# Patient Record
Sex: Female | Born: 1975 | Race: Black or African American | Hispanic: No | Marital: Married | Smoking: Never smoker
Health system: Southern US, Community
[De-identification: ages and names within clinical notes are randomized; demographics above are authoritative.]

## PROBLEM LIST (undated history)

## (undated) DIAGNOSIS — R7303 Prediabetes: Secondary | ICD-10-CM

## (undated) DIAGNOSIS — S8990XA Unspecified injury of unspecified lower leg, initial encounter: Secondary | ICD-10-CM

## (undated) DIAGNOSIS — G8929 Other chronic pain: Secondary | ICD-10-CM

## (undated) HISTORY — DX: Prediabetes: R73.03

## (undated) HISTORY — DX: Unspecified injury of unspecified lower leg, initial encounter: S89.90XA

## (undated) HISTORY — PX: WISDOM TOOTH EXTRACTION: SHX21

## (undated) HISTORY — DX: Other chronic pain: G89.29

## (undated) HISTORY — PX: NO PAST SURGERIES: SHX2092

---

## 1998-12-04 ENCOUNTER — Encounter: Payer: Self-pay | Admitting: Emergency Medicine

## 1998-12-04 ENCOUNTER — Emergency Department (HOSPITAL_COMMUNITY): Admission: EM | Admit: 1998-12-04 | Discharge: 1998-12-04 | Payer: Self-pay | Admitting: Emergency Medicine

## 1999-03-12 ENCOUNTER — Emergency Department (HOSPITAL_COMMUNITY): Admission: EM | Admit: 1999-03-12 | Discharge: 1999-03-13 | Payer: Self-pay | Admitting: *Deleted

## 2000-10-16 ENCOUNTER — Other Ambulatory Visit: Admission: RE | Admit: 2000-10-16 | Discharge: 2000-10-16 | Payer: Self-pay | Admitting: Family Medicine

## 2015-12-04 ENCOUNTER — Emergency Department (HOSPITAL_COMMUNITY)
Admission: EM | Admit: 2015-12-04 | Discharge: 2015-12-05 | Disposition: A | Discharge status: Home / Self Care | Payer: BLUE CROSS/BLUE SHIELD | Attending: Emergency Medicine | Admitting: Emergency Medicine

## 2015-12-04 ENCOUNTER — Encounter (HOSPITAL_COMMUNITY): Payer: Self-pay | Admitting: *Deleted

## 2015-12-04 DIAGNOSIS — J0100 Acute maxillary sinusitis, unspecified: Secondary | ICD-10-CM | POA: Insufficient documentation

## 2015-12-04 DIAGNOSIS — R110 Nausea: Secondary | ICD-10-CM | POA: Insufficient documentation

## 2015-12-04 DIAGNOSIS — R200 Anesthesia of skin: Secondary | ICD-10-CM | POA: Diagnosis not present

## 2015-12-04 DIAGNOSIS — R51 Headache: Secondary | ICD-10-CM

## 2015-12-04 DIAGNOSIS — R519 Headache, unspecified: Secondary | ICD-10-CM

## 2015-12-04 DIAGNOSIS — Z3202 Encounter for pregnancy test, result negative: Secondary | ICD-10-CM | POA: Diagnosis not present

## 2015-12-04 DIAGNOSIS — R42 Dizziness and giddiness: Secondary | ICD-10-CM | POA: Diagnosis not present

## 2015-12-04 LAB — BASIC METABOLIC PANEL
Anion gap: 10 (ref 5–15)
BUN: 9 mg/dL (ref 6–20)
CO2: 23 mmol/L (ref 22–32)
Calcium: 9 mg/dL (ref 8.9–10.3)
Chloride: 105 mmol/L (ref 101–111)
Creatinine, Ser: 0.74 mg/dL (ref 0.44–1.00)
GFR calc Af Amer: >60 mL/min (ref >60)
GFR calc non Af Amer: >60 mL/min (ref >60)
Glucose, Bld: 93 mg/dL (ref 65–99)
Potassium: 4 mmol/L (ref 3.5–5.1)
Sodium: 138 mmol/L (ref 135–145)

## 2015-12-04 LAB — CBC WITH DIFFERENTIAL/PLATELET
Basophils Absolute: 0 10*3/uL (ref 0.0–0.1)
Basophils Relative: 0 %
Eosinophils Absolute: 0.1 10*3/uL (ref 0.0–0.7)
Eosinophils Relative: 1 %
HCT: 43.9 % (ref 36.0–46.0)
Hemoglobin: 15.1 g/dL — ABNORMAL HIGH (ref 12.0–15.0)
Lymphocytes Relative: 31 %
Lymphs Abs: 1.8 10*3/uL (ref 0.7–4.0)
MCH: 30.7 pg (ref 26.0–34.0)
MCHC: 34.4 g/dL (ref 30.0–36.0)
MCV: 89.2 fL (ref 78.0–100.0)
Monocytes Absolute: 0.3 10*3/uL (ref 0.1–1.0)
Monocytes Relative: 6 %
Neutro Abs: 3.6 10*3/uL (ref 1.7–7.7)
Neutrophils Relative %: 62 %
Platelets: 323 10*3/uL (ref 150–400)
RBC: 4.92 MIL/uL (ref 3.87–5.11)
RDW: 13.1 % (ref 11.5–15.5)
WBC: 5.9 10*3/uL (ref 4.0–10.5)

## 2015-12-04 LAB — I-STAT BETA HCG BLOOD, ED (MC, WL, AP ONLY): I-stat hCG, quantitative: <5 m[IU]/mL (ref <5)

## 2015-12-04 MED ORDER — PROCHLORPERAZINE EDISYLATE 5 MG/ML IJ SOLN
10.0000 mg | Freq: Once | INTRAMUSCULAR | Status: AC
  Administered 2015-12-04: 10 mg via INTRAVENOUS
  Filled 2015-12-04: qty 2

## 2015-12-04 MED ORDER — DIPHENHYDRAMINE HCL 50 MG/ML IJ SOLN
12.5000 mg | Freq: Once | INTRAMUSCULAR | Status: AC
Start: 2015-12-04 — End: 2015-12-04
  Administered 2015-12-04: 12.5 mg via INTRAVENOUS
  Filled 2015-12-04: qty 1

## 2015-12-04 MED ORDER — SODIUM CHLORIDE 0.9 % IV BOLUS (SEPSIS)
1000.0000 mL | Freq: Once | INTRAVENOUS | Status: AC
  Administered 2015-12-04: 1000 mL via INTRAVENOUS

## 2015-12-04 NOTE — ED Notes (Addendum)
Pt states uri symptoms x 1 week and headache x 3 days.  Today while at work she experienced an episode of blurred vision lasting 30 min.  Denies photophobia and nausea presently.  Neuro intact in triage.

## 2015-12-04 NOTE — ED Notes (Signed)
Pt states she had one episode of vomiting.  Denies nausea at this time

## 2015-12-04 NOTE — ED Provider Notes (Signed)
CSN: [id number]     Arrival date & time 12/04/15  1614 History   First MD Initiated Contact with Patient 12/04/15 2201     Chief Complaint  Patient presents with  . Headache    HPI   Linda Ross is a 40 y.o. female with no pertinent PMH who presents to the ED with nasal congestion and cough x 1 week and headache x 3 days. She denies exacerbating factors. She has tried taking tylenol at home with no significant symptom relief. She denies fever, chills, chest pain, shortness of breath, abdominal pain. She reports nausea, though denies vomiting. She states she experienced blurry vision x 15 minutes while at work today, though states this is now resolved. She reports she has a history of headaches, but that this headache is at the top of her head, and that she usually has headaches distributed over her forehead and notes this headache is more severe. She notes associated dizziness and lightheadedness, now resolved. She denies numbness, weakness, paresthesia.   History reviewed. No pertinent past medical history. History reviewed. No pertinent past surgical history. No family history on file. Social History  Substance Use Topics  . Smoking status: Never Smoker   . Smokeless tobacco: None  . Alcohol Use: No   OB History    No data available      Review of Systems  Constitutional: Negative for fever and chills.  HENT: Positive for congestion.   Eyes: Positive for visual disturbance.  Respiratory: Positive for cough. Negative for shortness of breath.   Cardiovascular: Negative for chest pain.  Gastrointestinal: Positive for nausea. Negative for vomiting and abdominal pain.  Neurological: Positive for dizziness, light-headedness, numbness and headaches. Negative for syncope and weakness.  All other systems reviewed and are negative.     Allergies  Review of patient's allergies indicates no known allergies.  Home Medications   Prior to Admission medications   Medication Sig  Start Date End Date Taking? Authorizing Provider  amoxicillin-clavulanate (AUGMENTIN) 875-125 MG tablet Take 1 tablet by mouth every 12 (twelve) hours. 12/05/15   Marella Chimes, PA-C    BP 115/75 mmHg  Pulse 87  Temp(Src) 97.9 F (36.6 C) (Oral)  Resp 20  Ht 5\' 8"  (1.727 m)  Wt 80.74 kg  BMI 27.07 kg/m2  SpO2 95%  LMP 12/01/2015 Physical Exam  Constitutional: She is oriented to person, place, and time. She appears well-developed and well-nourished. No distress.  HENT:  Head: Normocephalic and atraumatic.  Right Ear: External ear normal.  Left Ear: External ear normal.  Nose: Nose normal. Right sinus exhibits no maxillary sinus tenderness and no frontal sinus tenderness. Left sinus exhibits no maxillary sinus tenderness and no frontal sinus tenderness.  Mouth/Throat: Uvula is midline, oropharynx is clear and moist and mucous membranes are normal.  Eyes: Conjunctivae, EOM and lids are normal. Pupils are equal, round, and reactive to light. Right eye exhibits no discharge. Left eye exhibits no discharge. No scleral icterus.  Neck: Normal range of motion. Neck supple.  Cardiovascular: Normal rate, regular rhythm, normal heart sounds, intact distal pulses and normal pulses.   Pulmonary/Chest: Effort normal and breath sounds normal. No respiratory distress. She has no wheezes. She has no rales.  Abdominal: Soft. Normal appearance and bowel sounds are normal. She exhibits no distension and no mass. There is no tenderness. There is no rigidity, no rebound and no guarding.  Musculoskeletal: Normal range of motion. She exhibits no edema or tenderness.  Neurological: She is alert  and oriented to person, place, and time. She has normal strength. No cranial nerve deficit or sensory deficit. Coordination normal.  Skin: Skin is warm, dry and intact. No rash noted. She is not diaphoretic. No erythema. No pallor.  Psychiatric: She has a normal mood and affect. Her speech is normal and behavior is  normal.  Nursing note and vitals reviewed.   ED Course  Procedures (including critical care time)  Labs Review Labs Reviewed  CBC WITH DIFFERENTIAL/PLATELET - Abnormal; Notable for the following:    Hemoglobin 15.1 (*)    All other components within normal limits  BASIC METABOLIC PANEL  I-STAT BETA HCG BLOOD, ED ([facility], [facility], [facility] ONLY)    Imaging Review Ct Head Wo Contrast  12/05/2015  CLINICAL DATA:  Headache for 4 days.  Blurry vision. EXAM: CT HEAD WITHOUT CONTRAST TECHNIQUE: Contiguous axial images were obtained from the base of the skull through the vertex without intravenous contrast. COMPARISON:  None. FINDINGS: Brain: No evidence of acute infarction, hemorrhage, extra-axial collection, ventriculomegaly, or mass effect. Vascular: No hyperdense vessel or unexpected calcification. Skull: Negative for fracture or focal lesion. Sinuses/Orbits: Small fluid level in the right maxillary and right side of sphenoid sinus. Mastoid air cells are clear. Other: None. IMPRESSION: 1.  No acute intracranial abnormality. 2. Right maxillary and sphenoid sinus disease, fluid levels may indicate acute sinusitis. Electronically Signed   By: Jeb Levering M.D.   On: 12/05/2015 00:24   I have personally reviewed and evaluated these images and lab results as part of my medical decision-making.   EKG Interpretation None      MDM   Final diagnoses:  Headache  Acute maxillary sinusitis, recurrence not specified    40 year old female presents with nasal congestion and cough for the past week and headaches for the past 3 days. Denies fever, chills, chest pain, shortness of breath, abdominal pain. Notes nausea, but denies vomiting. States she experienced blurry vision and dizziness prior to arrival, however reports this is resolved.   Patient is afebrile. Vital signs stable. Normal neuro exam with no focal deficit. Heart regular rate and rhythm. Lungs clear to auscultation bilaterally. Abdomen soft,  nontender, nondistended.  Will give fluids, compazine, benadryl. Labs pending.  CBC negative for leukocytosis or anemia. BMP unremarkable. Pregnancy negative. CT head negative for acute intracranial abnormality, remarkable for right maxillary and sphenoid sinus disease, fluid levels may indicate acute sinusitis. Discussed findings with patient. On reassessment, she reports significant symptom improvement. Patient is nontoxic and well-appearing, feel she is stable for discharge at this time will treat sinusitis with augmentin. Patient to follow-up with PCP. Return precautions discussed. Patient verbalizes her understanding and is in agreement with plan.  BP 115/75 mmHg  Pulse 87  Temp(Src) 97.9 F (36.6 C) (Oral)  Resp 20  Ht 5\' 8"  (1.727 m)  Wt 80.74 kg  BMI 27.07 kg/m2  SpO2 95%  LMP 12/01/2015     Marella Chimes, PA-C 12/05/15 0036  Carmin Muskrat, MD 12/05/15 [phone]

## 2015-12-05 ENCOUNTER — Emergency Department (HOSPITAL_COMMUNITY): Payer: BLUE CROSS/BLUE SHIELD

## 2015-12-05 MED ORDER — AMOXICILLIN-POT CLAVULANATE 875-125 MG PO TABS: 1.0000 | ORAL_TABLET | Freq: Two times a day (BID) | ORAL | Status: DC

## 2015-12-05 MED ORDER — AMOXICILLIN-POT CLAVULANATE 875-125 MG PO TABS
1.0000 | ORAL_TABLET | Freq: Once | ORAL | Status: AC
  Administered 2015-12-05: 1 via ORAL
  Filled 2015-12-05: qty 1

## 2015-12-05 NOTE — Discharge Instructions (Signed)
1. Medications: augmentin, usual home medications 2. Treatment: rest, drink plenty of fluids 3. Follow Up: please followup with your primary doctor for discussion of your diagnoses and further evaluation after today's visit; if you do not have a primary care doctor use the phone number listed in your discharge paperwork to find one; please return to the ER for severe headache, numbness, weakness, new or worsening symptoms   Sinusitis, Adult Sinusitis is redness, soreness, and puffiness (inflammation) of the air pockets in the bones of your face (sinuses). The redness, soreness, and puffiness can cause air and mucus to get trapped in your sinuses. This can allow germs to grow and cause an infection.  HOME CARE   Drink enough fluids to keep your pee (urine) clear or pale yellow.  Use a humidifier in your home.  Run a hot shower to create steam in the bathroom. Sit in the bathroom with the door closed. Breathe in the steam 3-4 times a day.  Put a warm, moist washcloth on your face 3-4 times a day, or as told by your doctor.  Use salt water sprays (saline sprays) to wet the thick fluid in your nose. This can help the sinuses drain.  Only take medicine as told by your doctor. GET HELP RIGHT AWAY IF:   Your pain gets worse.  You have very bad headaches.  You are sick to your stomach (nauseous).  You throw up (vomit).  You are very sleepy (drowsy) all the time.  Your face is puffy (swollen).  Your vision changes.  You have a stiff neck.  You have trouble breathing. MAKE SURE YOU:   Understand these instructions.  Will watch your condition.  Will get help right away if you are not doing well or get worse.   This information is not intended to replace advice given to you by your health care provider. Make sure you discuss any questions you have with your health care provider.   Document Released: 01/22/2008 Document Revised: 08/26/2014 Document Reviewed: 03/10/2012 Elsevier  Interactive Patient Education [employer].

## 2015-12-05 NOTE — ED Notes (Signed)
Patient able to ambulate independently  

## 2019-03-15 LAB — HM PAP SMEAR: HM Pap smear: NORMAL

## 2019-03-15 LAB — RESULTS CONSOLE HPV: CHL HPV: NEGATIVE

## 2019-08-04 ENCOUNTER — Ambulatory Visit (HOSPITAL_BASED_OUTPATIENT_CLINIC_OR_DEPARTMENT_OTHER): Admit: 2019-08-04 | Payer: BLUE CROSS/BLUE SHIELD | Admitting: Obstetrics and Gynecology

## 2019-08-04 ENCOUNTER — Encounter (HOSPITAL_BASED_OUTPATIENT_CLINIC_OR_DEPARTMENT_OTHER): Payer: Self-pay

## 2019-08-04 SURGERY — MYOMECTOMY, ROBOT-ASSISTED
Anesthesia: General

## 2020-06-20 ENCOUNTER — Other Ambulatory Visit: Payer: Self-pay | Admitting: Obstetrics and Gynecology

## 2020-07-18 NOTE — Progress Notes (Signed)
[place]  07/18/2020      Your procedure is scheduled on 07/27/2020   Report to [facility].  Call this number if you have problems the morning of [phone]  OUR ADDRESS IS [place], WE ARE LOCATED IN THE [facility] WITH [facility].   Remember:  Do not eat food after midnight. NO SOLID FOOD AFTER MIDNIGHT THE NIGHT PRIOR TO SURGERY. NOTHING BY MOUTH EXCEPT CLEAR LIQUIDS UNTIL       0430am  . PLEASE FINISH ENSURE DRINK PER SURGEON ORDER  WHICH NEEDS TO BE COMPLETED AT .0430am   Take these medicines the morning of surgery with A SIP OF WATER   None    CLEAR LIQUID DIET   Foods Allowed                                                                     Foods Excluded  Coffee and tea, regular and decaf                             liquids that you cannot  Plain Jell-O any favor except red or purple                                           see through such as: Fruit ices (not with fruit pulp)                                     milk, soups, orange juice  Iced Popsicles                                    All solid food Carbonated beverages, regular and diet                                    Cranberry, grape and apple juices Sports drinks like Gatorade Lightly seasoned clear broth or consume(fat free) Sugar, honey syrup  Sample Menu Breakfast                                Lunch                                     Supper Cranberry juice                    Beef broth                            Chicken broth Jell-O  Grape juice                           Apple juice Coffee or tea                        Jell-O                                      Popsicle                                                Coffee or tea                        Coffee or tea  _____________________________________________________________________     Do not wear jewelry, make-up or nail polish.  Do  not wear lotions, powders, or perfumes, or deoderant.  Do not shave 48 hours prior to surgery.  Men may shave face and neck.  Do not bring valuables to the hospital.  [facility] is not responsible for any belongings or valuables.  Contacts, dentures or bridgework may not be worn into surgery.  Leave your suitcase in the car.  After surgery it may be brought to your room.  For patients admitted to the hospital, discharge time will be determined by your treatment team.  Patients discharged the day of surgery will not be allowed to drive home.     Please read over the following fact sheets that you were given:

## 2020-07-19 ENCOUNTER — Other Ambulatory Visit: Payer: Self-pay

## 2020-07-19 ENCOUNTER — Encounter (HOSPITAL_COMMUNITY)
Admission: RE | Admit: 2020-07-19 | Discharge: 2020-07-19 | Disposition: A | Discharge status: Home / Self Care | Payer: BC Managed Care – PPO | Source: Ambulatory Visit | Attending: Obstetrics and Gynecology | Admitting: Obstetrics and Gynecology

## 2020-07-19 ENCOUNTER — Encounter (HOSPITAL_COMMUNITY): Payer: Self-pay

## 2020-07-19 DIAGNOSIS — Z01818 Encounter for other preprocedural examination: Secondary | ICD-10-CM | POA: Diagnosis present

## 2020-07-19 HISTORY — DX: Prediabetes: R73.03

## 2020-07-19 LAB — HEMOGLOBIN A1C
Hgb A1c MFr Bld: 5.3 % (ref 4.8–5.6)
Mean Plasma Glucose: 105.41 mg/dL

## 2020-07-19 LAB — CBC
HCT: 44 % (ref 36.0–46.0)
Hemoglobin: 14.9 g/dL (ref 12.0–15.0)
MCH: 30.3 pg (ref 26.0–34.0)
MCHC: 33.9 g/dL (ref 30.0–36.0)
MCV: 89.6 fL (ref 80.0–100.0)
Platelets: 315 10*3/uL (ref 150–400)
RBC: 4.91 MIL/uL (ref 3.87–5.11)
RDW: 14 % (ref 11.5–15.5)
WBC: 5.3 10*3/uL (ref 4.0–10.5)
nRBC: 0 % (ref 0.0–0.2)

## 2020-07-19 LAB — BASIC METABOLIC PANEL
Anion gap: 11 (ref 5–15)
BUN: 13 mg/dL (ref 6–20)
CO2: 20 mmol/L — ABNORMAL LOW (ref 22–32)
Calcium: 9.1 mg/dL (ref 8.9–10.3)
Chloride: 105 mmol/L (ref 98–111)
Creatinine, Ser: 0.71 mg/dL (ref 0.44–1.00)
GFR, Estimated: >60 mL/min (ref >60)
Glucose, Bld: 85 mg/dL (ref 70–99)
Potassium: 3.8 mmol/L (ref 3.5–5.1)
Sodium: 136 mmol/L (ref 135–145)

## 2020-07-19 NOTE — Progress Notes (Signed)
Have called and requested for covid positive results from [facility] tested covid positive on 05/20/2020.  Have asked for results be faxed to [phone].

## 2020-07-19 NOTE — H&P (Signed)
Linda Ross is a 44 y.o. female, G:0 presenting for a laparoscopic myomectomy because of  enlarging uterine fibroids. The patient was diagnosed in 2013 with fibroids and since that time they have significantly increased in size. On physical exam in 2020 the patient's uterus was assessed at 10-12 weeks size. The following year it had enlarged to 16 weeks size. The patient reports lower back pain, bloating, occasional urinary urgency, dyspareunia and both constipation and diarrhea. Her monthly menstrual flow lasts for 5 days with pad change only twice a day.  Her menstrual cramping is rated 7/10 but is relieved with extra strength Acetaminophen.  A pelvic ultrasound in  October 2021 revealed an anteverted uterus (520 cc): 11.29 x 8.73 x 10.06 cm, endometrium: 7.35 mm; 8 measurable fibroids #1 3.6 cm fundal IM #2 4 cm posterior fundal SS #3 5.8 cm anterior LUS IM #4 4.3 cm Right mid IM #5 1.76 cm IM LIKELY NOT ACCESSIBLE #6 2.8 cm IM #7 1.7 cm SM LIKELY NOT ACCESSIBLE #8 1. 9 cm SS Neither ovary was seen.  In 2020 the patient's uterine volume was 437 cc.  Given the patient's desire to conceive and the pattern of her fibroid enlargement,  the patient desires to undergo a laparoscopic  myomectomy.  Past Medical History  OB History: G:0  GYN History: menarche: 89[place];:  LMP : 07/17/2020;   Contraception: none;  Denies history of abnormal PAP smear.  Last PAP: Smear:2020-normal  Medical History: Vitamin D Deficiency  Surgical History: none Denies history of blood transfusions  Family History: Diabetes Mellitus, Prostate Cancer, Stroke, Alzheimer's Disease, End-Stage Renal Disease and Cardiac Arrhythmia  Social History: Married and employed as an Microbiologist;  Denies tobacco or alcohol use   Medications: Vitamin D 3 50,000 units weekly Multivitamin daily Probiotic daily  No Known Allergies   Denies sensitivity to peanuts, shellfish, soy, latex or adhesives.   ROS: Admits to  glasses, occasional headaches with some nausea and vision changes,lower extremity myalgias, and COVID in October 2021.  She denies nasal congestion, dysphagia, tinnitus, dizziness, hoarseness, cough,  chest pain, shortness of breath,  vomiting, diarrhea,constipation,  urinary frequency, urgency  dysuria, hematuria, vaginitis symptoms, pelvic pain, swelling of joints,easy bruising,  arthralgias, skin rashes, unexplained weight loss and except as is mentioned in the history of present illness, patient's review of systems is otherwise negative.     Physical Exam  Bp: 118/78;  P: 89 bpm; Temperature: 97.7 degrees F orally; Weight: 189 lbs.; Height: 189 lbs.; BMI: 28.7; O2Sat.: 99% (room air)   Neck: supple without masses or thyromegaly Lungs: clear to auscultation Heart: regular rate and rhythm Abdomen: firm mass from pelvis to 4 fingers above symphysis pubis, non-tender and no organomegaly Pelvic:EGBUS- wnl; vagina-normal rugae; uterus-16 weeks size; cervix without lesions or motion tenderness; adnexae-no tenderness or masses Extremities:  no clubbing, cyanosis or edema   Assesment: Enlarging  Uterine Fibroids   Disposition:  A discussion was held with patient regarding the indication for her procedure(s) along with the risks, which include but are not limited to: reaction to anesthesia, damage to adjacent organs, infection,  excessive bleeding, transient post operative facial edema, an open abdominal incision, inability to remove all of the fibroids and if endometrial cavity is breached, the need for a C-section delivery.   Robot-assisted myomectomy was reviewed with the patient.  Benefits of the robotic approach include lesser postoperative pain, less blood loss during surgery, reduced risk of injury to other organs due to better visualization with a  3-D HD 10 times magnifying camera, shorter hospital stay between 0-1 night and rapid recovery with return to daily routine in 2-3 weeks. Although  robot-assisted myomectomy has a longer operative time than traditional laparotomy, in a patient with good medical history, the benefits usually outweigh the risks.   Patient informed about FDA warning on use of morcellator dated 12/03/2012.   Discussed: 1. Incidence of post-operative diagnosis of sarcoma in women undergoing a hysterectomy is 2:1000  2. Annual incidence of leiomyosarcoma is 0.64/100,000 women  3. Sarcomas have the highest incidence in women over 65  4. Power morcellation involves risks of spreading tissue / disease. In he case of undiagnosed cancer, it may adversely affect the patient's prognosis.  Also discussed:  1.Lack of tactile feedback with the robotic approach leaving non-visible fibroids impossible to resect with potential future growth. 2. Need for cesarean section if there is uterine cavity entry and/or multiple incisions 3. Risk of developing  NEW fibroids estimated at 25 % The patient verbalized understanding of these risks and has consented to proceed with a Robot Assisted Myomectomy at [facility] on July 27, 2020 @ 7:30 a.m.    CSN# [id number]   Rolly Magri J. Florene Glen, PA-C  for Dr. Dede Query.[place]

## 2020-07-19 NOTE — Progress Notes (Signed)
[facility] - Preparing for Surgery Before surgery, you can play an important role.  Because skin is not sterile, your skin needs to be as free of germs as possible.  You can reduce the number of germs on your skin by washing with CHG (chlorahexidine gluconate) soap before surgery.  CHG is an antiseptic cleaner which kills germs and bonds with the skin to continue killing germs even after washing. Please DO NOT use if you have an allergy to CHG or antibacterial soaps.  If your skin becomes reddened/irritated stop using the CHG and inform your nurse when you arrive at Short Stay. Do not shave (including legs and underarms) for at least 48 hours prior to the first CHG shower.  You may shave your face/neck. Please follow these instructions carefully:  1.  Shower with CHG Soap the night before surgery and the  morning of Surgery.  2.  If you choose to wash your hair, wash your hair first as usual with your  normal  shampoo.  3.  After you shampoo, rinse your hair and body thoroughly to remove the  shampoo.                           4.  Use CHG as you would any other liquid soap.  You can apply chg directly  to the skin and wash                       Gently with a scrungie or clean washcloth.  5.  Apply the CHG Soap to your body ONLY FROM THE NECK DOWN.   Do not use on face/ open                           Wound or open sores. Avoid contact with eyes, ears mouth and genitals (private parts).                       Wash face,  Genitals (private parts) with your normal soap.             6.  Wash thoroughly, paying special attention to the area where your surgery  will be performed.  7.  Thoroughly rinse your body with warm water from the neck down.  8.  DO NOT shower/wash with your normal soap after using and rinsing off  the CHG Soap.                9.  Pat yourself dry with a clean towel.            10.  Wear clean pajamas.            11.  Place clean sheets on your bed the night of your first shower and  do not  sleep with pets. Day of Surgery : Do not apply any lotions/deodorants the morning of surgery.  Please wear clean clothes to the hospital/surgery center.  FAILURE TO FOLLOW THESE INSTRUCTIONS MAY RESULT IN THE CANCELLATION OF YOUR SURGERY PATIENT SIGNATURE_________________________________  NURSE SIGNATURE__________________________________  ________________________________________________________________________  

## 2020-07-20 NOTE — Progress Notes (Signed)
Requested x 2 this am covid results from [facility].  Received a call back.  Will need to contact pt and ask pt to call them for covid pos results from 10//09/2019.  Attempted to call pt x 2 with mailbox full.  Pt called back at 1130 am on 07/20/2020 .  Informed pt she would need to call Joelene Millin at [facility] at [phone] in order to get covid results.  Pt voiced understanding.  Also informed pt that she would need to drink Gatorade Lower sugar instead of the Ensure preop drink that was sent home with her at time of preop.  Paitent opted to go to grocery store and pick up 3- 12 ounces of Zero sugar Gatorade either in blue or orange.  And follow the same preop instructions  As with the Ensure - 2 nite before surgery at 1000pm and one am of 3 hours prior to surgery pt voiced understanding.  Pt aware if unable to get covid test results will need to have covid test on 07/24/2020/.

## 2020-07-21 NOTE — Progress Notes (Signed)
Positive covid test results from 05/20/2020 sent via email by pt to nurse.  Placed on chart.

## 2020-07-25 NOTE — Progress Notes (Addendum)
Patient called today and stated she noted in my chart that EKG done at preop was abnormal  Inforjmed pt that the EKG machine does an initial interpretation but that a cardiologist reads ekg to confirm the findings.  I also informed pt that if anything had needed attention the surgeon would have been notified .  I also informed pt that Anesthesia PA looks at EKGs also.  Informed pt that I would have Konrad Felix, PAC look at EKG again and call her with what PA states.  Konrad Felix, PA was notified of pt's call and looked at EKG .  Called pt back and told her that Konrad Felix , [place] stated findings  not concerning as long as asymptomatic and really inconclusive and need correlation with symptomatlogy.  And that patient could review with PCP. Pt stated she has had some chest pain off and on in the past but nothing significant.

## 2020-07-25 NOTE — Progress Notes (Signed)
Made Linda Ross aware that I had spoken with pt in regards to EKG and progress note made and that pt states she has had chest pain in the past off and on.

## 2020-07-27 ENCOUNTER — Encounter: Payer: Self-pay | Admitting: Internal Medicine

## 2020-07-27 ENCOUNTER — Ambulatory Visit (HOSPITAL_BASED_OUTPATIENT_CLINIC_OR_DEPARTMENT_OTHER)
Admission: RE | Admit: 2020-07-27 | Payer: BC Managed Care – PPO | Source: Ambulatory Visit | Admitting: Obstetrics and Gynecology

## 2020-07-27 ENCOUNTER — Other Ambulatory Visit: Payer: Self-pay

## 2020-07-27 ENCOUNTER — Ambulatory Visit: Payer: BC Managed Care – PPO | Admitting: Internal Medicine

## 2020-07-27 VITALS — BP 116/80 | HR 92 | Ht 68.0 in | Wt 186.0 lb

## 2020-07-27 DIAGNOSIS — Z01810 Encounter for preprocedural cardiovascular examination: Secondary | ICD-10-CM | POA: Diagnosis not present

## 2020-07-27 DIAGNOSIS — G2581 Restless legs syndrome: Secondary | ICD-10-CM

## 2020-07-27 DIAGNOSIS — R0789 Other chest pain: Secondary | ICD-10-CM

## 2020-07-27 DIAGNOSIS — R9431 Abnormal electrocardiogram [ECG] [EKG]: Secondary | ICD-10-CM | POA: Diagnosis not present

## 2020-07-27 SURGERY — MYOMECTOMY, ROBOT-ASSISTED
Anesthesia: Choice

## 2020-07-27 NOTE — Progress Notes (Signed)
OFFICE CONSULT NOTE  Chief Complaint:  Abnormal EKG, preop clearance, chest pain  Primary Care Physician: Linda Bern, MD  HPI:  Linda Ross is a 44 y.o. female who is being seen today for the evaluation of abnormal EKG, preop clearance and chest pain at the request of Linda Ross, Linda Look, MD.  This is a pleasant 44 year old female who was scheduled to have a robotic assisted myomectomy by Dr. Cletis Ross who is her GYN and primary care provider.  This was supposed to be today, however the procedure was canceled due to an abnormal preoperative EKG.  The EKG performed on 07/19/2020 demonstrated normal sinus rhythm, cannot rule out anterior infarct-age undetermined.  I personally reviewed the EKG and note that it does demonstrate poor R wave progression with transition between V3 and V4.  According to Linda Ross, she has been having intermittent episodes of chest pain which sounds sharp and not necessarily associated with exertion.  It was not felt that this was significant.  This was then brought up with the PA Linda Ross, who had discussed it and noted that the patient had had chest pain on and off.  Based on this the procedure was canceled and cardiology clearance was sought.  Linda Ross has few cardiovascular risk factors.  There is heart disease in her family including her father who had heart failure and renal failure secondary to diabetes.  Personally she has no diabetes, hypertension, known significant dyslipidemia and is not on medications.  She has also reported some restless legs but has not had that evaluated.  PMHx:  Past Medical History:  Diagnosis Date  . Pre-diabetes     Past Surgical History:  Procedure Laterality Date  . NO PAST SURGERIES      FAMHx:  Family History  Problem Relation Age of Onset  . Heart failure Father   . Diabetes Father   . Kidney failure Father     SOCHx:   reports that she has never smoked. She has never used smokeless tobacco. She reports  that she does not drink alcohol and does not use drugs.  ALLERGIES:  No Known Allergies  ROS: Pertinent items noted in HPI and remainder of comprehensive ROS otherwise negative.  HOME MEDS: Current Outpatient Medications on File Prior to Visit  Medication Sig Dispense Refill  . acetaminophen (TYLENOL) 500 MG tablet Take 500 mg by mouth every 6 (six) hours as needed.    . Cholecalciferol (VITAMIN D3) 1.25 MG (50000 UT) CAPS Take 50,000 Units by mouth once a week.    . Multiple Vitamins-Minerals (PX COMPLETE SENIOR MULTIVITS) TABS Take 1 tablet by mouth daily.    . Omega-3 1000 MG CAPS Take 1,000 mg by mouth daily.    Marland Kitchen selenium 50 MCG TABS tablet Take 50 mcg by mouth daily.    . vitamin B-12 (CYANOCOBALAMIN) 1000 MCG tablet Take 1,000 mcg by mouth daily.    Marland Kitchen zinc gluconate 50 MG tablet Take 50 mg by mouth daily.     No current facility-administered medications on file prior to visit.    LABS/IMAGING: No results found for this or any previous visit (from the past 48 hour(s)). No results found.  LIPID PANEL: No results found for: CHOL, TRIG, HDL, CHOLHDL, VLDL, LDLCALC, LDLDIRECT  WEIGHTS: Wt Readings from Last 3 Encounters:  07/27/20 186 lb (84.4 kg)  12/04/15 178 lb (80.7 kg)    VITALS: BP 116/80   Pulse 92   Ht 5\' 8"  (1.727 m)   Wt 186 lb (  84.4 kg)   BMI 28.28 kg/m   EXAM: General appearance: alert and no distress Neck: no carotid bruit, no JVD and thyroid not enlarged, symmetric, no tenderness/mass/nodules Lungs: clear to auscultation bilaterally Heart: regular rate and rhythm, S1, S2 normal, no murmur, click, rub or gallop Abdomen: soft, non-tender; bowel sounds normal; no masses,  no organomegaly Extremities: extremities normal, atraumatic, no cyanosis or edema Pulses: 2+ and symmetric Skin: Skin color, texture, turgor normal. No rashes or lesions Neurologic: Grossly normal Psych: Pleasant   **Examination chaperoned by Linda Ross, Linda Ross.  EKG: Normal sinus  rhythm at 68- personally reviewed  ASSESSMENT: 1. Abnormal EKG 2. Atypical chest pain 3. Indeterminate preoperative risk  PLAN: 1.   Linda Ross describes some episodes of atypical sounding chest pain which is not associated with exertion and does not seem to limit exercise.  She had an EKG which showed some abnormality with poor R wave progression however I suspect this was due to lead malpositioning.  We did repeat an EKG today in the office which is completely normal.  That being said there is some family history of heart disease in her father.  She has few risk factors but with her planned upcoming surgery, I would recommend a treadmill exercise stress test.  If this is a low risk study then she could proceed with surgery as planned in January.  She is agreeable to this approach.  I advised her to consider a referral for a sleep evaluation given her restless legs which could be treated.  We will contact her with results for follow-up and if the study is normal then follow-up can be as needed with me.  Thanks again for the kind referral.  Linda Casino, MD, FACC, Linda Ross Director of the [facility] Diplomate of the American Board of Clinical Lipidology Attending Cardiologist  Direct Dial: [phone]  Fax: [phone]  Website:  [email or web address] Linda Ross 07/27/2020, 9:55 PM

## 2020-07-27 NOTE — Patient Instructions (Signed)
Medication Instructions:  Your physician recommends that you continue on your current medications as directed. Please refer to the Current Medication list given to you today.  *If you need a refill on your cardiac medications before your next appointment, please call your pharmacy*   Lab Work: You will need to have the coronavirus test completed prior to your procedure. An appointment has been made at ______ on  _______. This is a Drive Up Visit at the [address] [place]. Please tell them that you are there for pre-procedure testing. Someone will direct you to the appropriate testing line. Stay in your car and someone will be with you shortly. Please make sure to have all other labs completed before this test because you will need to stay quarantined until your procedure. Please take your insurance card to this test.    Testing/Procedures: Dr. Debara Pickett has ordered an exercise tolerance test to be done at his office  Exercise Stress Test  An exercise stress test is a test that is done to collect information about how your heart functions during exercise. The test is done while you are walking on a treadmill or using an exercise bike. The goal is to raise your heart rate and "stress" the heart. The heart is evaluated before, during, and after you exercise. An electrocardiogram (ECG) will be used to monitor the heart, and your blood pressure will also be monitored. In some cases, nuclear scanning or an ultrasound of the heart (echocardiogram) will also be done to evaluate your heart. An exercise stress test is done to look for coronary artery disease (CAD). The test may also be done to:  Evaluate your limits of exercise during cardiac rehabilitation.  Check for high blood pressure during exercise.  Check how well you can exercise after such treatments as coronary stenting or new medicines.  Check for problems with blood flow to your arms and legs during exercise. If you have an  abnormal test result, this may mean that you are not getting enough blood flow to your heart during exercise. More testing may be needed to understand why your test was not normal. Tell a health care provider about:  Any allergies you have.  All medicines you are taking, including vitamins, herbs, eye drops, creams, and over-the-counter medicines.  Any blood disorders you have.  Any surgeries you have had.  Any medical conditions you have.  Whether you are pregnant or may be pregnant. What are the risks? Generally, this is a safe procedure. However, problems may occur, including:  Pain or pressure in the following areas: ? Chest. ? Jaw or neck. ? Between your shoulder blades. ? Down your left arm.  Dizziness or lightheadedness.  Shortness of breath.  Increased or irregular heartbeats.  Nausea or vomiting.  Heart attack (rare).  Life-threatening abnormal heart rhythm (rare). What happens before the procedure?  Follow instructions from your health care provider about eating or drinking restrictions. ? You may be told to avoid all forms of caffeine for 24 hours before the test. This includes coffee, tea (even decaffeinated tea), caffeinated sodas, chocolate, cocoa, and certain pain medicines.  Ask your health care provider about: ? Taking over-the-counter medicines, vitamins, herbs, and supplements. ? Changing or stopping your regular medicines. This is especially important if you are taking diabetes medicines or beta-blocker medicines.  If you have diabetes, ask how you are to take your insulin or pills. It is common to adjust your insulin dose the morning of the test.  If you are taking beta-blocker medicines, it is important to talk to your health care provider about these medicines well before the date of your test. Taking beta-blocker medicines may interfere with the test. In some cases, these medicines may need to be changed or stopped 24 hours or more before the  test.  If you wear a nitroglycerin patch, it may need to be removed prior to the test. Ask your health care provider if the patch should be removed before the test.  If you use an inhaler for any breathing condition, bring it with you to the test.  Do not apply lotions, powders, creams, or oils on your chest prior to the test.  Wear loose-fitting clothes and comfortable walking shoes.  Do not use any products that contain nicotine or tobacco, such as cigarettes and e-cigarettes, for 4 hours before the test or as told by your health care provider. If you need help quitting, ask your health care provider. What happens during the procedure?  Multiple electrodes will be attached to your chest.  Multiple wires will be attached to the electrodes. These will transfer the electrical impulses from your heart to the ECG machine. Your heart will be monitored both at rest and while exercising.  If you are also having an echocardiogram or nuclear scanning, images of your heart will be taken before and after you exercise.  A blood pressure cuff will be placed around your arm to measure your blood pressure throughout the test. You will feel it tighten and loosen throughout the test.  You will walk on a treadmill or use a stationary bike. If you cannot use these, you may be asked to turn a crank with your hands.  You will start at a slow pace or level on the exercise machine. The exercise difficulty will be slowly increased to raise your heart rate. In the case of a treadmill, the speed and incline will gradually be increased.  You may be asked to periodically breathe into a tube. This measures the gases you breathe out.  You will be asked how you are feeling throughout the test. You will be asked to rate your level of exertion.  Tell the staff right away if you feel: ? Chest pain. ? Dizziness. ? Shortness of breath. ? Too fatigued to continue. ? Pain or aching in your legs or arms.  You will  exercise until you have symptoms or until you reach a target heart rate. The test will also be stopped if you have changes in your blood pressure or ECG readings, or if you develop an irregular heartbeat (arrhythmia). The procedure may vary among health care providers and hospitals. What happens after the procedure?  You will sit down and recover from the exercise. Your blood pressure, heart rate, and ECG will be monitored until you recover.  You may return to your normal schedule, including diet, activities, and medicines, unless your health care provider tells you otherwise.  It is up to you to get your test results. Ask your health care provider, or the department that is doing the test, when your results will be ready. Summary  An exercise stress test is a test that is done to collect information about how your heart functions during exercise.  This test is done to look for coronary artery disease (CAD).  During this test, you will walk on a treadmill or use an exercise bike to raise your heart rate.  It is important to follow instructions from your health  care provider about eating and drinking restrictions before the test. This may include avoiding caffeine and certain medicines before the test. This information is not intended to replace advice given to you by your health care provider. Make sure you discuss any questions you have with your health care provider. Document Revised: 05/08/2017 Document Reviewed: 10/09/2016 Elsevier Patient Education  [place]: At [facility], you and your health needs are our priority.  As part of our continuing mission to provide you with exceptional heart care, we have created designated Provider Care Teams.  These Care Teams include your primary Cardiologist (physician) and Advanced Practice Providers (APPs -  Physician Assistants and Nurse Practitioners) who all work together to provide you with the care you need, when you  need it.  We recommend signing up for the patient portal called "MyChart".  Sign up information is provided on this After Visit Summary.  MyChart is used to connect with patients for Virtual Visits (Telemedicine).  Patients are able to view lab/test results, encounter notes, upcoming appointments, etc.  Non-urgent messages can be sent to your provider as well.   To learn more about what you can do with MyChart, go to [email or web address].    Your next appointment:   AS NEEDED with Dr. Debara Pickett

## 2020-07-31 NOTE — Addendum Note (Signed)
Addended by: Pixie Casino on: 07/31/2020 09:57 AM   Modules accepted: Orders

## 2020-08-01 ENCOUNTER — Other Ambulatory Visit (HOSPITAL_COMMUNITY): Payer: BC Managed Care – PPO

## 2020-08-01 NOTE — Progress Notes (Signed)
Pt not tested for covid today due to pt testing + for covid on 05/20/20. Pt has a copy of these results. Based on the guidelines the pt is in the 90 day window to not retest. The pt is still expected to quarantine until their procedure. Therefore, the pt can still have the scheduled procedure. These are the guidelines as follows:  Guidance: Patient previously tested + COVID; now past 90 day window seeking elective surgery (asymptomatic)  Retest patient If negative, proceed with surgery If positive, postpone surgery for 10 days from positive test Patient to quarantine for the (10 days) Do not retest again prior to surgery (even if scheduled a couple of weeks out) Use standard precautions for surgery   Jacqlyn Larsen, RN

## 2020-08-04 ENCOUNTER — Telehealth (HOSPITAL_COMMUNITY): Payer: Self-pay | Admitting: *Deleted

## 2020-08-04 ENCOUNTER — Ambulatory Visit (HOSPITAL_COMMUNITY)
Admission: RE | Admit: 2020-08-04 | Discharge: 2020-08-04 | Disposition: A | Discharge status: Home / Self Care | Payer: BC Managed Care – PPO | Source: Ambulatory Visit | Attending: Cardiovascular Disease | Admitting: Cardiovascular Disease

## 2020-08-04 ENCOUNTER — Other Ambulatory Visit: Payer: Self-pay

## 2020-08-04 DIAGNOSIS — Z01810 Encounter for preprocedural cardiovascular examination: Secondary | ICD-10-CM | POA: Insufficient documentation

## 2020-08-04 LAB — EXERCISE TOLERANCE TEST
Estimated workload: 13 METS
Exercise duration (min): 10 min
Exercise duration (sec): 47 s
MPHR: 171 {beats}/min
Peak HR: 181 {beats}/min
Percent HR: 101 %
Rest HR: 94 {beats}/min

## 2020-08-04 NOTE — Telephone Encounter (Signed)
Close encounter 

## 2020-08-24 ENCOUNTER — Encounter (HOSPITAL_BASED_OUTPATIENT_CLINIC_OR_DEPARTMENT_OTHER): Payer: Self-pay

## 2020-08-24 ENCOUNTER — Ambulatory Visit (HOSPITAL_BASED_OUTPATIENT_CLINIC_OR_DEPARTMENT_OTHER): Admit: 2020-08-24 | Payer: BC Managed Care – PPO | Admitting: Obstetrics and Gynecology

## 2020-08-24 SURGERY — MYOMECTOMY, ROBOT-ASSISTED
Anesthesia: Choice

## 2021-05-09 DIAGNOSIS — Z0000 Encounter for general adult medical examination without abnormal findings: Secondary | ICD-10-CM | POA: Diagnosis not present

## 2021-09-02 ENCOUNTER — Encounter (HOSPITAL_BASED_OUTPATIENT_CLINIC_OR_DEPARTMENT_OTHER): Payer: Self-pay

## 2021-09-02 ENCOUNTER — Other Ambulatory Visit: Payer: Self-pay

## 2021-09-02 ENCOUNTER — Ambulatory Visit
Admission: EM | Admit: 2021-09-02 | Discharge: 2021-09-02 | Disposition: A | Discharge status: Home / Self Care | Payer: BC Managed Care – PPO

## 2021-09-02 ENCOUNTER — Emergency Department (HOSPITAL_BASED_OUTPATIENT_CLINIC_OR_DEPARTMENT_OTHER)
Admission: EM | Admit: 2021-09-02 | Discharge: 2021-09-02 | Disposition: A | Discharge status: Home / Self Care | Payer: BC Managed Care – PPO | Attending: Emergency Medicine | Admitting: Emergency Medicine

## 2021-09-02 ENCOUNTER — Encounter: Payer: Self-pay | Admitting: Emergency Medicine

## 2021-09-02 ENCOUNTER — Emergency Department (HOSPITAL_BASED_OUTPATIENT_CLINIC_OR_DEPARTMENT_OTHER): Payer: BC Managed Care – PPO

## 2021-09-02 DIAGNOSIS — R519 Headache, unspecified: Secondary | ICD-10-CM | POA: Insufficient documentation

## 2021-09-02 DIAGNOSIS — R9431 Abnormal electrocardiogram [ECG] [EKG]: Secondary | ICD-10-CM | POA: Diagnosis not present

## 2021-09-02 LAB — CBC WITH DIFFERENTIAL/PLATELET
Abs Immature Granulocytes: 0.01 10*3/uL (ref 0.00–0.07)
Basophils Absolute: 0 10*3/uL (ref 0.0–0.1)
Basophils Relative: 0 %
Eosinophils Absolute: 0.1 10*3/uL (ref 0.0–0.5)
Eosinophils Relative: 1 %
HCT: 47.9 % — ABNORMAL HIGH (ref 36.0–46.0)
Hemoglobin: 16.1 g/dL — ABNORMAL HIGH (ref 12.0–15.0)
Immature Granulocytes: 0 %
Lymphocytes Relative: 35 %
Lymphs Abs: 2 10*3/uL (ref 0.7–4.0)
MCH: 30.1 pg (ref 26.0–34.0)
MCHC: 33.6 g/dL (ref 30.0–36.0)
MCV: 89.5 fL (ref 80.0–100.0)
Monocytes Absolute: 0.4 10*3/uL (ref 0.1–1.0)
Monocytes Relative: 8 %
Neutro Abs: 3.2 10*3/uL (ref 1.7–7.7)
Neutrophils Relative %: 56 %
Platelets: 542 10*3/uL — ABNORMAL HIGH (ref 150–400)
RBC: 5.35 MIL/uL — ABNORMAL HIGH (ref 3.87–5.11)
RDW: 13.7 % (ref 11.5–15.5)
WBC: 5.7 10*3/uL (ref 4.0–10.5)
nRBC: 0 % (ref 0.0–0.2)

## 2021-09-02 LAB — BASIC METABOLIC PANEL
Anion gap: 10 (ref 5–15)
BUN: 10 mg/dL (ref 6–20)
CO2: 22 mmol/L (ref 22–32)
Calcium: 9 mg/dL (ref 8.9–10.3)
Chloride: 106 mmol/L (ref 98–111)
Creatinine, Ser: 0.73 mg/dL (ref 0.44–1.00)
GFR, Estimated: >60 mL/min (ref >60)
Glucose, Bld: 89 mg/dL (ref 70–99)
Potassium: 4 mmol/L (ref 3.5–5.1)
Sodium: 138 mmol/L (ref 135–145)

## 2021-09-02 LAB — HCG, SERUM, QUALITATIVE: Preg, Serum: NEGATIVE

## 2021-09-02 MED ORDER — METOCLOPRAMIDE HCL 5 MG/ML IJ SOLN
10.0000 mg | Freq: Once | INTRAMUSCULAR | Status: AC
  Administered 2021-09-02: 10 mg via INTRAVENOUS
  Filled 2021-09-02: qty 2

## 2021-09-02 MED ORDER — KETOROLAC TROMETHAMINE 15 MG/ML IJ SOLN
15.0000 mg | Freq: Once | INTRAMUSCULAR | Status: AC
  Administered 2021-09-02: 15 mg via INTRAVENOUS
  Filled 2021-09-02: qty 1

## 2021-09-02 NOTE — Discharge Instructions (Signed)
Please go to the emergency department as soon as you leave urgent care for further evaluation and management. ?

## 2021-09-02 NOTE — ED Provider Notes (Signed)
[facility]    CSN: [id number] Arrival date & time: 09/02/21  0810      History   Chief Complaint Chief Complaint  Patient presents with   Headache    HPI Linda Ross is a 46 y.o. female.   Patient presents with headache that has been present since 08/08/2021.  She reports that the headache was intermittent until 08/27/2021, and has been persistent since that date.  Patient reports that she has had some associated dizziness that has been present for 2 days as well as some blurred vision.  Headache is present on the left side of head above eye and radiates across left side of head and down left side of neck.  Has also had runny nose since symptoms started.  Denies cough, nasal congestion, fever.  Denies any known sick contacts.  Patient has taken Tylenol, sinus medication, over-the-counter pain relievers with no improvement in headache.  Denies chest pain, shortness of breath, nausea, vomiting.   Headache  Past Medical History:  Diagnosis Date   Pre-diabetes     There are no problems to display for this patient.   Past Surgical History:  Procedure Laterality Date   NO PAST SURGERIES      OB History   No obstetric history on file.      Home Medications    Prior to Admission medications   Medication Sig Start Date End Date Taking? Authorizing Provider  acetaminophen (TYLENOL) 500 MG tablet Take 500 mg by mouth every 6 (six) hours as needed.   Yes [provider]  Cholecalciferol (VITAMIN D3) 1.25 MG (50000 UT) CAPS Take 50,000 Units by mouth once a week. 06/07/20  Yes [provider]  Multiple Vitamins-Minerals (PX COMPLETE SENIOR MULTIVITS) TABS Take 1 tablet by mouth daily.   Yes [provider]  Omega-3 1000 MG CAPS Take 1,000 mg by mouth daily.   Yes [provider]  selenium 50 MCG TABS tablet Take 50 mcg by mouth daily.   Yes [provider]  vitamin B-12 (CYANOCOBALAMIN) 1000 MCG tablet Take 1,000 mcg  by mouth daily.   Yes [provider]  zinc gluconate 50 MG tablet Take 50 mg by mouth daily.   Yes [provider]    Family History Family History  Problem Relation Age of Onset   Heart failure Father    Diabetes Father    Kidney failure Father     Social History Social History   Tobacco Use   Smoking status: Never   Smokeless tobacco: Never  Vaping Use   Vaping Use: Never used  Substance Use Topics   Alcohol use: No   Drug use: No     Allergies   Patient has no known allergies.   Review of Systems Review of Systems Per HPI  Physical Exam Triage Vital Signs ED Triage Vitals [09/02/21 0833]  Enc Vitals Group     BP 119/68     Pulse Rate 90     Resp 18     Temp 98.4 F (36.9 C)     Temp Source Oral     SpO2 98 %     Weight 180 lb (81.6 kg)     Height 5\' 8"  (1.727 m)     Head Circumference      Peak Flow      Pain Score 7     Pain Loc      Pain Edu?      Excl. in [place]?  No data found.  Updated Vital Signs BP 119/68 (BP Location: Left Arm)    Pulse 90    Temp 98.4 F (36.9 C) (Oral)    Resp 18    Ht 5\' 8"  (1.727 m)    Wt 180 lb (81.6 kg)    LMP 07/19/2021    SpO2 98%    BMI 27.37 kg/m   Visual Acuity Right Eye Distance:   Left Eye Distance:   Bilateral Distance:    Right Eye Near:   Left Eye Near:    Bilateral Near:     Physical Exam Constitutional:      General: She is not in acute distress.    Appearance: Normal appearance. She is not toxic-appearing or diaphoretic.  HENT:     Head: Normocephalic and atraumatic.      Comments: Tenderness to palpation to left side of head at circled area on diagram.    Right Ear: Tympanic membrane and ear canal normal.     Left Ear: Tympanic membrane and ear canal normal.     Nose: Congestion present.     Mouth/Throat:     Pharynx: No posterior oropharyngeal erythema.  Eyes:     Extraocular Movements: Extraocular movements intact.     Conjunctiva/sclera: Conjunctivae normal.      Pupils: Pupils are equal, round, and reactive to light.  Cardiovascular:     Rate and Rhythm: Normal rate and regular rhythm.     Pulses: Normal pulses.     Heart sounds: Normal heart sounds.  Pulmonary:     Effort: Pulmonary effort is normal. No respiratory distress.     Breath sounds: Normal breath sounds.  Neurological:     General: No focal deficit present.     Mental Status: She is alert and oriented to person, place, and time. Mental status is at baseline.     Cranial Nerves: Cranial nerves 2-12 are intact.     Sensory: Sensation is intact.     Motor: Motor function is intact.     Coordination: Coordination is intact.     Gait: Gait is intact.  Psychiatric:        Mood and Affect: Mood normal.        Behavior: Behavior normal.        Thought Content: Thought content normal.        Judgment: Judgment normal.     UC Treatments / Results  Labs (all labs ordered are listed, but only abnormal results are displayed) Labs Reviewed - No data to display  EKG   Radiology No results found.  Procedures Procedures (including critical care time)  Medications Ordered in UC Medications - No data to display  Initial Impression / Assessment and Plan / UC Course  I have reviewed the triage vital signs and the nursing notes.  Pertinent labs & imaging results that were available during my care of the patient were reviewed by me and considered in my medical decision making (see chart for details).     It is a high possibility that patient could have sinus infection causing symptoms.  Although, location of patient's headache is not super consistent with sinus infection.  She also has associated dizziness and blurry vision which is worrisome.  Advised patient that it would be beneficial to have imaging of the head to rule out more worrisome etiologies.  Unable to order CT scan in urgent care.  Advised patient to go to the hospital for further evaluation and management.  Patient was  agreeable with plan.  Vital signs stable at discharge.  Agree with patient self transport to the hospital. Final Clinical Impressions(s) / UC Diagnoses   Final diagnoses:  Acute intractable headache, unspecified headache type     Discharge Instructions      Please go to the emergency department as soon as you leave urgent care for further evaluation and management.    ED Prescriptions   None    PDMP not reviewed this encounter.   Teodora Medici, [place] 09/02/21 [phone]

## 2021-09-02 NOTE — ED Triage Notes (Signed)
Patient states that she is having a throbbing headache on the left side of her head x 6 days.  Some dizziness x 2 days, the pain radiates down her neck.  This has happened in the past in 2018 dx w/sinus infection.  Patient has taken Tylenol, Sinus and Headache meds.  Some relief but it returns.

## 2021-09-02 NOTE — ED Provider Notes (Signed)
[facility] EMER[facility]ENCY DEPT Provider Note   CSN: [id number] Arrival date & time: 09/02/21  0913     History  Chief Complaint  Patient presents with   Headache    Linda Ross is a 46 y.o. female.  Presents ER with concern for headache.  Patient states that she has had a headache for about a week, frontal, aching, worse on left side.  States that she previously had an episode of dizziness/lightheadedness/room spinning sensation but this resolved relatively quickly and has not recurred at all today.  Has tried over-the-counter medications with minimal relief.  Feels similar to when she was diagnosed with sinusitis.  Went to urgent care initially and was advised to come to ER for possible CT scan.  No numbness or weakness.  Denies any current visual disturbances.  No speech changes.  No chest pain or difficulty in breathing.  Additional history obtained from chart review, reviewed recent PCP visit from earlier in the day.  HPI     Home Medications Prior to Admission medications   Medication Sig Start Date End Date Taking? Authorizing Provider  acetaminophen (TYLENOL) 500 MG tablet Take 500 mg by mouth every 6 (six) hours as needed.    [provider]  Cholecalciferol (VITAMIN D3) 1.25 MG (50000 UT) CAPS Take 50,000 Units by mouth once a week. 06/07/20   [provider]  Multiple Vitamins-Minerals (PX COMPLETE SENIOR MULTIVITS) TABS Take 1 tablet by mouth daily.    [provider]  Omega-3 1000 MG CAPS Take 1,000 mg by mouth daily.    [provider]  selenium 50 MCG TABS tablet Take 50 mcg by mouth daily.    [provider]  vitamin B-12 (CYANOCOBALAMIN) 1000 MCG tablet Take 1,000 mcg by mouth daily.    [provider]  zinc gluconate 50 MG tablet Take 50 mg by mouth daily.    [provider]      Allergies    Patient has no known allergies.    Review of Systems   Review of Systems  Constitutional:   Negative for chills and fever.  HENT:  Positive for sinus pressure. Negative for ear pain and sore throat.   Eyes:  Negative for pain and visual disturbance.  Respiratory:  Negative for cough and shortness of breath.   Cardiovascular:  Negative for chest pain and palpitations.  Gastrointestinal:  Negative for abdominal pain and vomiting.  Genitourinary:  Negative for dysuria and hematuria.  Musculoskeletal:  Negative for arthralgias and back pain.  Skin:  Negative for color change and rash.  Neurological:  Positive for headaches. Negative for seizures and syncope.  All other systems reviewed and are negative.  Physical Exam Updated Vital Signs BP 112/80    Pulse 80    Temp 97.9 F (36.6 C) (Oral)    Resp 18    LMP 07/23/2021 (Approximate)    SpO2 99%  Physical Exam Vitals and nursing note reviewed.  Constitutional:      General: She is not in acute distress.    Appearance: She is well-developed.  HENT:     Head: Normocephalic and atraumatic.  Eyes:     Conjunctiva/sclera: Conjunctivae normal.  Cardiovascular:     Rate and Rhythm: Normal rate and regular rhythm.     Heart sounds: No murmur heard. Pulmonary:     Effort: Pulmonary effort is normal. No respiratory distress.  Abdominal:     Palpations: Abdomen is soft.     Tenderness: There is no abdominal  tenderness.  Musculoskeletal:        General: No swelling.     Cervical back: Normal range of motion and neck supple. No rigidity.  Skin:    General: Skin is warm and dry.     Capillary Refill: Capillary refill takes less than 2 seconds.  Neurological:     Mental Status: She is alert and oriented to person, place, and time.  Psychiatric:        Mood and Affect: Mood normal.    ED Results / Procedures / Treatments   Labs (all labs ordered are listed, but only abnormal results are displayed) Labs Reviewed  CBC WITH DIFFERENTIAL/PLATELET - Abnormal; Notable for the following components:      Result Value   RBC 5.35 (*)     Hemoglobin 16.1 (*)    HCT 47.9 (*)    Platelets 542 (*)    All other components within normal limits  HCG, SERUM, QUALITATIVE  BASIC METABOLIC PANEL    EKG EKG Interpretation  Date/Time:  Sunday September 02 2021 11:29:16 EST Ventricular Rate:  88 PR Interval:  191 QRS Duration: 82 QT Interval:  347 QTC Calculation: 420 R Axis:   56 Text Interpretation: Sinus rhythm Low voltage, precordial leads ST elev, probable normal early repol pattern Confirmed by Madalyn Rob [phone]) on 09/02/2021 12:30:00 PM  Radiology CT Head Wo Contrast  Result Date: 09/02/2021 CLINICAL DATA:  Headache. EXAM: CT HEAD WITHOUT CONTRAST TECHNIQUE: Contiguous axial images were obtained from the base of the skull through the vertex without intravenous contrast. RADIATION DOSE REDUCTION: This exam was performed according to the departmental dose-optimization program which includes automated exposure control, adjustment of the mA and/or kV according to patient size and/or use of iterative reconstruction technique. COMPARISON:  12/05/2015. FINDINGS: Brain: No evidence of acute infarction, hemorrhage, hydrocephalus, extra-axial collection or mass lesion/mass effect. Vascular: No hyperdense vessel or unexpected calcification. Skull: Normal. Negative for fracture or focal lesion. Sinuses/Orbits: Visualized globes and orbits are unremarkable. The visualized sinuses are clear. Other: None. IMPRESSION: Normal unenhanced CT scan of the brain. Electronically Signed   By: Lajean Manes M.D.   On: 09/02/2021 10:55    Procedures Procedures    Medications Ordered in ED Medications  metoCLOPramide (REGLAN) injection 10 mg (10 mg Intravenous Given 09/02/21 1131)  ketorolac (TORADOL) 15 MG/ML injection 15 mg (15 mg Intravenous Given 09/02/21 1130)    ED Course/ Medical Decision Making/ A&P                           Medical Decision Making  46 year old lady presented to ER with concern for headache.  On exam she appears  well in no acute distress.  Vital signs grossly within normal limits.  No neck stiffness, normal neck range of motion, no fever, doubt CNS infection.  Due to her episode of dizziness/lightheadedness, check basic labs, EKG.  Basic labs, CBC and BMP are within normal limits.  No electrolyte derangement or anemia noted.  CT head obtained which was negative for stroke, bleeding, mass.  Visualized sinuses were clear.  Suspect tension type headache versus viral upper respiratory illness.  On reassessment symptoms greatly improved after headache cocktail.   Recommend supportive care, follow-up with primary care doctor.    Additional history obtained from chart review, review of recent urgent care visit.       Final Clinical Impression(s) / ED Diagnoses Final diagnoses:  Nonintractable headache, unspecified chronicity pattern, unspecified headache type  Rx / DC Orders ED Discharge Orders     None         Lucrezia Starch, MD 09/02/21 1312

## 2021-09-02 NOTE — ED Triage Notes (Signed)
She c/o frontal h/a x ~ 1 week. She states that during this interim she has had "some dizzness 2 different days-but it got better". She further tells me she had similar s/s in 2018 and was dx with sinusitis a that time. She is alert and oriented x 4 with clear speech.

## 2021-09-02 NOTE — Discharge Instructions (Signed)
Follow-up with your primary care doctor.  Take Tylenol, Motrin as needed for pain control.  Come back to ER if you develop uncontrolled pain, fever, vomiting or other new concerning symptom.

## 2021-09-03 ENCOUNTER — Ambulatory Visit: Payer: BC Managed Care – PPO

## 2021-10-11 DIAGNOSIS — N939 Abnormal uterine and vaginal bleeding, unspecified: Secondary | ICD-10-CM | POA: Diagnosis not present

## 2021-10-11 DIAGNOSIS — R0981 Nasal congestion: Secondary | ICD-10-CM | POA: Diagnosis not present

## 2021-10-15 DIAGNOSIS — N938 Other specified abnormal uterine and vaginal bleeding: Secondary | ICD-10-CM | POA: Diagnosis not present

## 2021-10-15 DIAGNOSIS — R070 Pain in throat: Secondary | ICD-10-CM | POA: Diagnosis not present

## 2021-10-15 DIAGNOSIS — J209 Acute bronchitis, unspecified: Secondary | ICD-10-CM | POA: Diagnosis not present

## 2021-10-15 DIAGNOSIS — Z20822 Contact with and (suspected) exposure to covid-19: Secondary | ICD-10-CM | POA: Diagnosis not present

## 2021-10-15 DIAGNOSIS — R059 Cough, unspecified: Secondary | ICD-10-CM | POA: Diagnosis not present

## 2022-01-02 ENCOUNTER — Telehealth: Payer: BC Managed Care – PPO | Admitting: Physician Assistant

## 2022-01-02 DIAGNOSIS — R5383 Other fatigue: Secondary | ICD-10-CM | POA: Diagnosis not present

## 2022-01-02 DIAGNOSIS — B9689 Other specified bacterial agents as the cause of diseases classified elsewhere: Secondary | ICD-10-CM | POA: Diagnosis not present

## 2022-01-02 DIAGNOSIS — J019 Acute sinusitis, unspecified: Secondary | ICD-10-CM | POA: Diagnosis not present

## 2022-01-02 DIAGNOSIS — M545 Low back pain, unspecified: Secondary | ICD-10-CM | POA: Diagnosis not present

## 2022-01-02 MED ORDER — AMOXICILLIN-POT CLAVULANATE 875-125 MG PO TABS: 1.0000 | ORAL_TABLET | Freq: Two times a day (BID) | ORAL | 0 refills | Status: DC

## 2022-01-02 NOTE — Progress Notes (Signed)
?Virtual Visit Consent  ? ?[facility], you are scheduled for a virtual visit with a [facility] provider today. Just as with appointments in the office, your consent must be obtained to participate. Your consent will be active for this visit and any virtual visit you may have with one of our providers in the next 365 days. If you have a MyChart account, a copy of this consent can be sent to you electronically. ? ?As this is a virtual visit, video technology does not allow for your provider to perform a traditional examination. This may limit your provider's ability to fully assess your condition. If your provider identifies any concerns that need to be evaluated in person or the need to arrange testing (such as labs, EKG, etc.), we will make arrangements to do so. Although advances in technology are sophisticated, we cannot ensure that it will always work on either your end or our end. If the connection with a video visit is poor, the visit may have to be switched to a telephone visit. With either a video or telephone visit, we are not always able to ensure that we have a secure connection. ? ?By engaging in this virtual visit, you consent to the provision of healthcare and authorize for your insurance to be billed (if applicable) for the services provided during this visit. Depending on your insurance coverage, you may receive a charge related to this service. ? ?I need to obtain your verbal consent now. Are you willing to proceed with your visit today? Linda Ross has provided verbal consent on 01/02/2022 for a virtual visit (video or telephone). Mar Daring, PA-C ? ?Date: 01/02/2022 2:47 PM ? ?Virtual Visit via Video Note  ? [name], connected with  Linda Ross  ([id number], Jun 06, 1976) on 01/02/22 at  2:30 PM EDT by a video-enabled telemedicine application and verified that I am speaking with the correct person using two identifiers. ? ?Location: ?Patient: Virtual Visit Location  Patient: Other: work;isolated ?Provider: Virtual Visit Location Provider: Home Office ?  ?I discussed the limitations of evaluation and management by telemedicine and the availability of in person appointments. The patient expressed understanding and agreed to proceed.   ? ?History of Present Illness: ?Linda Ross is a 46 y.o. who identifies as a female who was assigned female at birth, and is being seen today for a few issues. ? ?HPI: Headache  ?This is a recurrent problem. The current episode started 1 to 4 weeks ago. The problem occurs intermittently. The problem has been gradually worsening. The pain is located in the Left unilateral region. The pain radiates to the face. The pain quality is similar to prior headaches. The quality of the pain is described as aching and throbbing. The pain is moderate. Associated symptoms include back pain (low back), coughing, drainage (post nasal drainage), rhinorrhea and sinus pressure. Pertinent negatives include no abdominal pain, anorexia, blurred vision, dizziness, ear pain, eye pain, eye redness, eye watering, facial sweating, fever, hearing loss, insomnia, loss of balance, muscle aches, nausea, neck pain, numbness, phonophobia, photophobia, scalp tenderness, seizures, sore throat, swollen glands, tingling, tinnitus, visual change, vomiting, weakness or weight loss. Associated symptoms comments: Fatigue, hemoptysis (streaked in mucous coughed up this morning x 2). The symptoms are aggravated by unknown. She has tried acetaminophen and NSAIDs for the symptoms. The treatment provided no relief.   ? ?Does report having increased fatigue for a few weeks/month at this time. Has been having some increased low back pain as well.  Does have history of Uterine Fibroids, unsure if they are source for back pain.  ? ?Problems: There are no problems to display for this patient. ?  ?Allergies:  ?Allergies  ?Allergen Reactions  ? Guaifenesin & Derivatives Other (See Comments)  ?   headache  ? ?Medications:  ?Current Outpatient Medications:  ?  amoxicillin-clavulanate (AUGMENTIN) 875-125 MG tablet, Take 1 tablet by mouth 2 (two) times daily., Disp: 14 tablet, Rfl: 0 ?  acetaminophen (TYLENOL) 500 MG tablet, Take 500 mg by mouth every 6 (six) hours as needed., Disp: , Rfl:  ?  Cholecalciferol (VITAMIN D3) 1.25 MG (50000 UT) CAPS, Take 50,000 Units by mouth once a week., Disp: , Rfl:  ?  Multiple Vitamins-Minerals (PX COMPLETE SENIOR MULTIVITS) TABS, Take 1 tablet by mouth daily., Disp: , Rfl:  ?  Omega-3 1000 MG CAPS, Take 1,000 mg by mouth daily., Disp: , Rfl:  ?  selenium 50 MCG TABS tablet, Take 50 mcg by mouth daily., Disp: , Rfl:  ?  vitamin B-12 (CYANOCOBALAMIN) 1000 MCG tablet, Take 1,000 mcg by mouth daily., Disp: , Rfl:  ?  zinc gluconate 50 MG tablet, Take 50 mg by mouth daily., Disp: , Rfl:  ? ?Observations/Objective: ?Patient is well-developed, well-nourished in no acute distress.  ?Resting comfortably ?Head is normocephalic, atraumatic.  ?No labored breathing.  ?Speech is clear and coherent with logical content.  ?Patient is alert and oriented at baseline.  ? ? ?Assessment and Plan: ?1. Acute bacterial sinusitis ?- amoxicillin-clavulanate (AUGMENTIN) 875-125 MG tablet; Take 1 tablet by mouth 2 (two) times daily.  Dispense: 14 tablet; Refill: 0 ? ?2. Fatigue, unspecified type ? ?3. Acute bilateral low back pain without sciatica ? ?- Worsening symptoms that have not responded to OTC medications.  ?- Will give Augmentin ?- Continue allergy medications.  ?- Steam and humidifier can help ?- Stay well hydrated and get plenty of rest.  ?- Seek in person evaluation for fatigue and low back pain as there may be another source causing these issues ?- Seek in person evaluation if no symptom improvement or if symptoms worsen ? ? ?Follow Up Instructions: ?I discussed the assessment and treatment plan with the patient. The patient was provided an opportunity to ask questions and all were  answered. The patient agreed with the plan and demonstrated an understanding of the instructions.  A copy of instructions were sent to the patient via MyChart unless otherwise noted below.  ? ? ?The patient was advised to call back or seek an in-person evaluation if the symptoms worsen or if the condition fails to improve as anticipated. ? ?Time:  ?I spent 15 minutes with the patient via telehealth technology discussing the above problems/concerns.   ? ?Mar Daring, PA-C ?

## 2022-01-02 NOTE — Patient Instructions (Signed)
?[facility], thank you for joining Mar Daring, PA-C for today's virtual visit.  While this provider is not your primary care provider (PCP), if your PCP is located in our provider database this encounter information will be shared with them immediately following your visit. ? ?Consent: ?(Patient) Linda Ross provided verbal consent for this virtual visit at the beginning of the encounter. ? ?Current Medications: ? ?Current Outpatient Medications:  ?  amoxicillin-clavulanate (AUGMENTIN) 875-125 MG tablet, Take 1 tablet by mouth 2 (two) times daily., Disp: 14 tablet, Rfl: 0 ?  acetaminophen (TYLENOL) 500 MG tablet, Take 500 mg by mouth every 6 (six) hours as needed., Disp: , Rfl:  ?  Cholecalciferol (VITAMIN D3) 1.25 MG (50000 UT) CAPS, Take 50,000 Units by mouth once a week., Disp: , Rfl:  ?  Multiple Vitamins-Minerals (PX COMPLETE SENIOR MULTIVITS) TABS, Take 1 tablet by mouth daily., Disp: , Rfl:  ?  Omega-3 1000 MG CAPS, Take 1,000 mg by mouth daily., Disp: , Rfl:  ?  selenium 50 MCG TABS tablet, Take 50 mcg by mouth daily., Disp: , Rfl:  ?  vitamin B-12 (CYANOCOBALAMIN) 1000 MCG tablet, Take 1,000 mcg by mouth daily., Disp: , Rfl:  ?  zinc gluconate 50 MG tablet, Take 50 mg by mouth daily., Disp: , Rfl:   ? ?Medications ordered in this encounter:  ?Meds ordered this encounter  ?Medications  ? amoxicillin-clavulanate (AUGMENTIN) 875-125 MG tablet  ?  Sig: Take 1 tablet by mouth 2 (two) times daily.  ?  Dispense:  14 tablet  ?  Refill:  0  ?  Order Specific Question:   Supervising Provider  ?  Answer:   Noemi Chapel [[id number]]  ?  ? ?*If you need refills on other medications prior to your next appointment, please contact your pharmacy* ? ?Follow-Up: ?Call back or seek an in-person evaluation if the symptoms worsen or if the condition fails to improve as anticipated. ? ?Other Instructions ?Sinus Pain ? ?Sinus pain may occur when your sinuses become clogged or swollen. Sinuses are air-filled spaces in  your skull that are behind the bones of your face and forehead. Sinus pain can range from mild to severe. ?What are the causes? ?Sinus pain can result from various conditions that affect the sinuses. Common causes include: ?Colds. ?Sinus infections. ?Allergies. ?What are the signs or symptoms? ?The main symptom of this condition is pain or pressure in your face, forehead, ears, or upper teeth. People who have sinus pain often have other symptoms, such as: ?Congested or runny nose. ?Fever. ?Inability to smell. ?Headache. ?Weather changes can make symptoms worse. ?How is this diagnosed? ?Your health care provider will diagnose this condition based on your symptoms and a physical exam. If you have pain that keeps coming back or does not go away, your health care provider may recommend more testing. This may include: ?Imaging tests, such as a CT scan or MRI, to check for problems with your sinuses. ?Examination of your sinuses using a thin tool with a camera that is inserted through your nose (endoscopy). ?How is this treated? ?Treatment for this condition depends on the cause. ?Sinus pain that is caused by a sinus infection may be treated with antibiotic medicine. ?Sinus pain that is caused by congestion may be helped by rinsing out (flushing) the nose and sinuses with saline solution. ?Sinus pain that is caused by allergies may be helped by allergy medicines (antihistamines) and medicated nasal sprays. ?Sinus surgery may be needed in some cases if  other treatments do not help. ?Follow these instructions at home: ?General instructions ?If directed: ?Apply a warm, moist washcloth to your face to help relieve pain. ?Use a nasal saline wash. Follow the directions on the bottle or box. ?Hydrate and humidify ?Drink enough water to keep your urine clear or pale yellow. Staying hydrated will help to thin your mucus. ?Use a humidifier if your home is dry. ?Inhale steam for 10-15 minutes, 3-4 times a day or as told by your  health care provider. You can do this in the bathroom while a hot shower is running. ?Limit your exposure to cool or dry air. ?Medicines ? ?Take over-the-counter and prescription medicines only as told by your health care provider. ?If you were prescribed an antibiotic medicine, take it as told by your health care provider. Do not stop taking the antibiotic even if you start to feel better. ?If you have congestion, use a nasal spray to help lessen pressure. ?Contact a health care provider if: ?You have sinus pain more than one time a week. ?You have sensitivity to light or sound. ?You develop a fever. ?You feel nauseous or you vomit. ?Your sinus pain or headache does not get better with treatment. ?Get help right away if: ?You have vision problems. ?You have sudden, severe pain in your face or head. ?You have a seizure. ?You are confused. ?You have a stiff neck. ?Summary ?Sinus pain occurs when your sinuses become clogged or swollen. ?Sinus pain can result from various conditions that affect the sinuses, such as a cold, a sinus infection, or an allergy. ?Treatment for this condition depends on the cause. It may include medicine, such as antibiotics or antihistamines. ?This information is not intended to replace advice given to you by your health care provider. Make sure you discuss any questions you have with your health care provider. ?Document Revised: 07/08/2021 Document Reviewed: 07/08/2021 ?Elsevier Patient Education ? [place]. ? ? ?Hemoptysis ?Hemoptysis is when you cough up blood. If it is mild, you may cough up small amounts of bloody spit and mucus from your lungs. If it is very bad, you may cough up a lot of blood. If you cough up 1-2 cups (240-480 mL) of blood within 24 hours, get help right away. ?Common causes of mild (nonmassive) hemoptysis include: ?Infection in your nose, throat, or lungs. ?Nosebleeds. ?Breathing in an object that is not meant to be inside your body. ?Common causes of very  bad (massive) hemoptysis include: ?Tuberculosis (TB). ?A tumor in the lungs or upper airway. ?A blood clot in the lungs. ?Stomach bleeding or ulcer. ?Taking blood thinners. ?Having problems with blood clotting. ?Sometimes, the cause is not known. ?Follow these instructions at home: ?Medicines ?If you were prescribed an antibiotic medicine, take it as told by your doctor. Do not stop taking it even if you start to feel better. ?Take over-the-counter and prescription medicines only as told by your doctor. ?General instructions ? ?Do not smoke or use any products that contain nicotine or tobacco. If you need help quitting, ask your doctor. ?Return to your normal activities when your doctor says that it is safe. ?Ask your doctor if it is okay for you to exercise or go on an airplane. ?Keep all follow-up visits. ?Contact a doctor if: ?You have a fever over 100.4?F (38?C). ?You cough up more blood than before. ?The blood looks brighter or darker red. ?Get help right away if: ?You cough up fresh blood or blood clots. ?You cough up 1-2  cups (240-480 mL) of blood in 24 hours. ?You have trouble breathing. ?You feel like you are choking. ?You have chest pain. ?You feel dizzy or light-headed. ?These symptoms may be an emergency. Get help right away. Call 911. ?Do not wait to see if the symptoms will go away. ?Do not drive yourself to the hospital. ?Summary ?Hemoptysis is coughing up blood. ?If you cough up 1-2 cups (240-480 mL) of blood in 24 hours, get help right away. ?Do not smoke or use any products that contain nicotine or tobacco. If you need help quitting, ask your doctor. ?This information is not intended to replace advice given to you by your health care provider. Make sure you discuss any questions you have with your health care provider. ?Document Revised: 03/12/2021 Document Reviewed: 03/12/2021 ?Elsevier Patient Education ? [place]. ? ? ? ?If you have been instructed to have an in-person evaluation today  at a local Urgent Care facility, please use the link below. It will take you to a list of all of our available [facility], including address, phone number and hours of operation. Please do

## 2022-01-10 ENCOUNTER — Ambulatory Visit (INDEPENDENT_AMBULATORY_CARE_PROVIDER_SITE_OTHER): Payer: BC Managed Care – PPO | Admitting: Nurse Practitioner

## 2022-01-10 ENCOUNTER — Encounter: Payer: Self-pay | Admitting: Nurse Practitioner

## 2022-01-10 VITALS — BP 110/88 | HR 98 | Temp 98.1°F | Ht 68.0 in | Wt 190.6 lb

## 2022-01-10 DIAGNOSIS — M545 Low back pain, unspecified: Secondary | ICD-10-CM

## 2022-01-10 DIAGNOSIS — D751 Secondary polycythemia: Secondary | ICD-10-CM | POA: Diagnosis not present

## 2022-01-10 DIAGNOSIS — G43709 Chronic migraine without aura, not intractable, without status migrainosus: Secondary | ICD-10-CM | POA: Diagnosis not present

## 2022-01-10 DIAGNOSIS — G8929 Other chronic pain: Secondary | ICD-10-CM | POA: Diagnosis not present

## 2022-01-10 MED ORDER — PROPRANOLOL HCL ER 60 MG PO CP24: 60.0000 mg | ORAL_CAPSULE | Freq: Every day | ORAL | 1 refills | Status: DC

## 2022-01-10 NOTE — Progress Notes (Signed)
New Patient Office Visit  Subjective    Patient ID: Linda Ross, female    DOB: [birth year]  Age: 46 y.o. MRN: [id number]  CC:  Chief Complaint  Patient presents with   Establish Care    Pt c/o intermittent headaches w/ some sensitivity to light and nausea and lower back pain w/ fatigue for several months    HPI Linda Ross presents for new patient visit to establish care.  Introduced to Designer, [employer]ellery role and practice setting.  All questions answered.  Discussed provider/patient relationship and expectations.  She has been having intermittent headaches that come and go, just about every day for the past 4 months. They tend to be on left side of her head. She has been having intermittent blurred vision, and sensitivity to light. She went to ER and eye doctor. She had a head CT in the ER which was normal. She has also been having fatigue. She denies snoring and trouble sleeping. She takes tylenol as needed for severe headaches, but tries not to take this every day.  She has a history of chronic low back pain, that is worse at night laying down. She has a history of 8 uterine fibroids which she was told may be causing the pain. The pain does not radiate.   Depression and Anxiety Screen:     01/10/2022    4:52 PM  Depression screen PHQ 2/9  Decreased Interest 0  Down, Depressed, Hopeless 0  PHQ - 2 Score 0  Altered sleeping 0  Tired, decreased energy 2  Change in appetite 0  Feeling bad or failure about yourself  0  Trouble concentrating 0  Moving slowly or fidgety/restless 0  Suicidal thoughts 0  PHQ-9 Score 2      01/10/2022    4:52 PM  GAD 7 : Generalized Anxiety Score  Nervous, Anxious, on Edge 0  Control/stop worrying 0  Worry too much - different things 0  Trouble relaxing 0  Restless 0  Easily annoyed or irritable 0  Afraid - awful might happen 0  Total GAD 7 Score 0  Anxiety Difficulty Not difficult at all    Outpatient Encounter  Medications as of 01/10/2022  Medication Sig   propranolol ER (INDERAL LA) 60 MG 24 hr capsule Take 1 capsule (60 mg total) by mouth daily.   acetaminophen (TYLENOL) 500 MG tablet Take 500 mg by mouth every 6 (six) hours as needed.   amoxicillin-clavulanate (AUGMENTIN) 875-125 MG tablet Take 1 tablet by mouth 2 (two) times daily.   cetirizine (ZYRTEC) 10 MG tablet cetirizine 10 mg tablet  TAKE 1 TABLET BY MOUTH EVERY DAY FOR 7 DAYS   Cholecalciferol (VITAMIN D3) 1.25 MG (50000 UT) CAPS Take 50,000 Units by mouth once a week.   ibuprofen (ADVIL) 600 MG tablet ibuprofen 600 mg tablet   Multiple Vitamins-Minerals (PX COMPLETE SENIOR MULTIVITS) TABS Take 1 tablet by mouth daily.   Omega-3 1000 MG CAPS Take 1,000 mg by mouth daily.   selenium 50 MCG TABS tablet Take 50 mcg by mouth daily.   vitamin B-12 (CYANOCOBALAMIN) 1000 MCG tablet Take 1,000 mcg by mouth daily.   zinc gluconate 50 MG tablet Take 50 mg by mouth daily.   [DISCONTINUED] fluticasone (FLONASE) 50 MCG/ACT nasal spray fluticasone propionate 50 mcg/actuation nasal spray,suspension  SPRAY 1 SPRAY BY INTRANASAL ROUTE EVERY DAY   [DISCONTINUED] leuprolide (LUPRON DEPOT, 16-MONTH,) 11.25 MG injection Lupron Depot 11.25 mg (3 month) intramuscular syringe kit   No facility-administered encounter  medications on file as of 01/10/2022.    Past Medical History:  Diagnosis Date   Chronic back pain    PCL injury    Pre-diabetes    Prediabetes     Past Surgical History:  Procedure Laterality Date   WISDOM TOOTH EXTRACTION      Family History  Problem Relation Age of Onset   Hypertension Mother    Cancer Father        prostate   Hypertension Father    Heart failure Father    Diabetes Father    Kidney failure Father     Social History   Socioeconomic History   Marital status: Married    Spouse name: Not on file   Number of children: Not on file   Years of education: Not on file   Highest education level: Not on file   Occupational History   Not on file  Tobacco Use   Smoking status: Never   Smokeless tobacco: Never  Vaping Use   Vaping Use: Never used  Substance and Sexual Activity   Alcohol use: No   Drug use: No   Sexual activity: Not on file  Other Topics Concern   Not on file  Social History Narrative   Not on file   Social Determinants of Health   Financial Resource Strain: Not on file  Food Insecurity: Not on file  Transportation Needs: Not on file  Physical Activity: Not on file  Stress: Not on file  Social Connections: Not on file  Intimate Partner Violence: Not on file    Review of Systems  Constitutional:  Positive for malaise/fatigue. Negative for fever.  HENT:  Positive for sore throat. Negative for congestion, ear pain and sinus pain.   Eyes:  Positive for blurred vision (with headache).  Respiratory: Negative.    Cardiovascular: Negative.   Gastrointestinal: Negative.   Genitourinary: Negative.   Musculoskeletal:  Positive for back pain.  Skin: Negative.   Neurological:  Positive for dizziness (sometimes with headaches) and headaches.  Psychiatric/Behavioral: Negative.       Objective    BP 110/88 (BP Location: Right Arm, Patient Position: Sitting, Cuff Size: Normal)   Pulse 98   Temp 98.1 F (36.7 C) (Temporal)   Ht 5' 8"  (1.727 m)   Wt 190 lb 9.6 oz (86.5 kg)   LMP 12/24/2021 (Exact Date)   SpO2 96%   BMI 28.98 kg/m   Physical Exam Vitals and nursing note reviewed.  Constitutional:      General: She is not in acute distress.    Appearance: Normal appearance.  HENT:     Head: Normocephalic and atraumatic.     Right Ear: Tympanic membrane, ear canal and external ear normal.     Left Ear: Tympanic membrane, ear canal and external ear normal.     Nose: Nose normal.     Mouth/Throat:     Mouth: Mucous membranes are moist.     Pharynx: Oropharynx is clear.  Eyes:     Extraocular Movements: Extraocular movements intact.     Conjunctiva/sclera:  Conjunctivae normal.     Pupils: Pupils are equal, round, and reactive to light.  Cardiovascular:     Rate and Rhythm: Normal rate and regular rhythm.     Pulses: Normal pulses.     Heart sounds: Normal heart sounds.  Pulmonary:     Effort: Pulmonary effort is normal.     Breath sounds: Normal breath sounds.  Abdominal:     Palpations:  Abdomen is soft.     Tenderness: There is no abdominal tenderness.  Musculoskeletal:        General: Normal range of motion.     Cervical back: Normal range of motion.     Right lower leg: No edema.     Left lower leg: No edema.  Skin:    General: Skin is warm and dry.  Neurological:     General: No focal deficit present.     Mental Status: She is alert and oriented to person, place, and time.     Cranial Nerves: No cranial nerve deficit.     Coordination: Coordination normal.     Gait: Gait normal.  Psychiatric:        Mood and Affect: Mood normal.        Behavior: Behavior normal.        Thought Content: Thought content normal.        Judgment: Judgment normal.    Last CBC Lab Results  Component Value Date   WBC 5.7 09/02/2021   HGB 16.1 (H) 09/02/2021   HCT 47.9 (H) 09/02/2021   MCV 89.5 09/02/2021   MCH 30.1 09/02/2021   RDW 13.7 09/02/2021   PLT 542 (H) 62/70/3500   Last metabolic panel Lab Results  Component Value Date   GLUCOSE 89 09/02/2021   NA 138 09/02/2021   K 4.0 09/02/2021   CL 106 09/02/2021   CO2 22 09/02/2021   BUN 10 09/02/2021   CREATININE 0.73 09/02/2021   GFRNONAA >60 09/02/2021   CALCIUM 9.0 09/02/2021   ANIONGAP 10 09/02/2021      Assessment & Plan:   Problem List Items Addressed This Visit       Cardiovascular and Mediastinum   Chronic migraine without aura without status migrainosus, not intractable - Primary    Chronic, ongoing.  She has been having almost daily headaches since January.  She went to the ER when it first occurred and she had a CT head which was negative.  With some vision  changes, nausea, sensitivity to light, occurring on the left side of her head, sounds like migraine type headache.  If her headache gets really bad, she does take Tylenol which helps sometimes with the pain.  Continue Tylenol or ibuprofen as needed.  We will start propanolol 60 mg daily to help with migraine prevention.  Follow-up in 3 to 4 weeks.       Relevant Medications   ibuprofen (ADVIL) 600 MG tablet   propranolol ER (INDERAL LA) 60 MG 24 hr capsule     Other   Chronic bilateral low back pain without sciatica    She has a history of chronic back pain that occurs when she lays down at night to go to bed.  She does have a history of fibroids, she states that she has 8 of them in her uterus.  She is currently following with GYN with this and believes this is what is causing the pain.  With ongoing, chronic pain can consider x-ray of lumbar spine.  Follow-up in 3 to 4 weeks.       Relevant Medications   ibuprofen (ADVIL) 600 MG tablet   Erythrocytosis    Hemoglobin elevated on labs checked back in January and was 16.1.  She does take multivitamin daily, encouraged her to look to see if it contains iron and if it does find a multivitamin without iron.  We will repeat labs at next visit, after she has finished her  antibiotic for her recent sinus infection.        Return in about 3 weeks (around 01/31/2022) for headache.   Charyl Dancer, NP

## 2022-01-10 NOTE — Patient Instructions (Signed)
It was great to see you!  Start propanolol '60mg'$  once a day to help prevent your headaches. This may take about a week to notice any changes.   Let's follow-up in 3-4 weeks, sooner if you have concerns.  If a referral was placed today, you will be contacted for an appointment. Please note that routine referrals can sometimes take up to 3-4 weeks to process. Please call our office if you haven't heard anything after this time frame.  Take care,  Vance Peper, NP

## 2022-01-11 DIAGNOSIS — M545 Low back pain, unspecified: Secondary | ICD-10-CM | POA: Insufficient documentation

## 2022-01-11 DIAGNOSIS — D751 Secondary polycythemia: Secondary | ICD-10-CM | POA: Insufficient documentation

## 2022-01-11 DIAGNOSIS — G43709 Chronic migraine without aura, not intractable, without status migrainosus: Secondary | ICD-10-CM | POA: Insufficient documentation

## 2022-01-11 NOTE — Assessment & Plan Note (Signed)
Hemoglobin elevated on labs checked back in January and was 16.1.  She does take multivitamin daily, encouraged her to look to see if it contains iron and if it does find a multivitamin without iron.  We will repeat labs at next visit, after she has finished her antibiotic for her recent sinus infection.

## 2022-01-11 NOTE — Assessment & Plan Note (Signed)
She has a history of chronic back pain that occurs when she lays down at night to go to bed.  She does have a history of fibroids, she states that she has 8 of them in her uterus.  She is currently following with GYN with this and believes this is what is causing the pain.  With ongoing, chronic pain can consider x-ray of lumbar spine.  Follow-up in 3 to 4 weeks.

## 2022-01-11 NOTE — Assessment & Plan Note (Signed)
Chronic, ongoing.  She has been having almost daily headaches since January.  She went to the ER when it first occurred and she had a CT head which was negative.  With some vision changes, nausea, sensitivity to light, occurring on the left side of her head, sounds like migraine type headache.  If her headache gets really bad, she does take Tylenol which helps sometimes with the pain.  Continue Tylenol or ibuprofen as needed.  We will start propanolol 60 mg daily to help with migraine prevention.  Follow-up in 3 to 4 weeks.

## 2022-01-30 NOTE — Progress Notes (Signed)
Established Patient Office Visit  Subjective   Patient ID: Linda Ross, female    DOB: [birth year]  Age: 46 y.o. MRN: [id number]  Chief Complaint  Patient presents with   Follow-up    3 wk f/u Headache. Pt states she has not had any headaches or migraines since last ov    HPI  Kristie is here to follow-up on migraines. Last visit she was started on propanolol ER '60mg'$  daily for migraine prevention. She states that she never started this medication, because her headaches went away. She is unsure if it was due to stress at work. Also, her hemoglobin and platelets were also noted to be elevated on prior labs and she is interested in repeating this today.   She is still having ongoing low back pain that radiates around to the front of her hips. She states that it only happens when she lays down and is sleeping at night. When she is up during the day, she doesn't have any pain. She has a history of fibroids and is not sure if this is causing her pain. She has a follow-up appointment with GYN in August, however would like an x-ray of her back to make sure that nothing else is occurring.   Past Medical History:  Diagnosis Date   Chronic back pain    PCL injury    Pre-diabetes    Prediabetes    Past Surgical History:  Procedure Laterality Date   WISDOM TOOTH EXTRACTION     ROS See pertinent positives and negatives per HPI.    Objective:     BP 109/70   Pulse 87   Temp (!) 97.3 F (36.3 C) (Temporal)   Wt 190 lb 3.2 oz (86.3 kg)   LMP 12/24/2021 (Exact Date)   SpO2 98%   BMI 28.92 kg/m    Physical Exam Vitals and nursing note reviewed.  Constitutional:      General: She is not in acute distress.    Appearance: Normal appearance.  HENT:     Head: Normocephalic.  Eyes:     Conjunctiva/sclera: Conjunctivae normal.  Cardiovascular:     Rate and Rhythm: Normal rate and regular rhythm.     Pulses: Normal pulses.     Heart sounds: Normal heart sounds.  Pulmonary:      Effort: Pulmonary effort is normal.     Breath sounds: Normal breath sounds.  Musculoskeletal:        General: No tenderness. Normal range of motion.     Cervical back: Normal range of motion.  Skin:    General: Skin is warm.  Neurological:     General: No focal deficit present.     Mental Status: She is alert and oriented to person, place, and time.  Psychiatric:        Mood and Affect: Mood normal.        Behavior: Behavior normal.        Thought Content: Thought content normal.        Judgment: Judgment normal.     Assessment & Plan:   Problem List Items Addressed This Visit       Cardiovascular and Mediastinum   Chronic migraine without aura without status migrainosus, not intractable    She has not had a headache since last visit and she didn't start the propanolol. She has the medication in case her headaches come back again. Follow-up with any concerns.         Other   Chronic bilateral  low back pain without sciatica    Chronic, ongoing. Pain only occurs when she is laying down at night. Will check x-ray of lumbar spine today. She also has a follow-up appointment with GYN in August for fibroids. Continue using heat as needed. Follow-up based on results.       Relevant Orders   DG Lumbar Spine Complete   Erythrocytosis - Primary    Hemoglobin and platelets were elevated on prior labs. Will check CBC, iron panel, and CMP today.       Relevant Orders   CBC with Differential/Platelet   Iron, TIBC and Ferritin Panel   Comprehensive metabolic panel   Other Visit Diagnoses     Screening, lipid       Screen lipid panel today   Relevant Orders   Lipid panel       Return if symptoms worsen or fail to improve.    Charyl Dancer, NP

## 2022-01-31 ENCOUNTER — Encounter: Payer: Self-pay | Admitting: Nurse Practitioner

## 2022-01-31 ENCOUNTER — Ambulatory Visit (INDEPENDENT_AMBULATORY_CARE_PROVIDER_SITE_OTHER): Payer: BC Managed Care – PPO | Admitting: Nurse Practitioner

## 2022-01-31 VITALS — BP 109/70 | HR 87 | Temp 97.3°F | Wt 190.2 lb

## 2022-01-31 DIAGNOSIS — D751 Secondary polycythemia: Secondary | ICD-10-CM

## 2022-01-31 DIAGNOSIS — G43709 Chronic migraine without aura, not intractable, without status migrainosus: Secondary | ICD-10-CM

## 2022-01-31 DIAGNOSIS — G8929 Other chronic pain: Secondary | ICD-10-CM

## 2022-01-31 DIAGNOSIS — M545 Low back pain, unspecified: Secondary | ICD-10-CM | POA: Diagnosis not present

## 2022-01-31 DIAGNOSIS — Z13220 Encounter for screening for lipoid disorders: Secondary | ICD-10-CM

## 2022-01-31 DIAGNOSIS — D75839 Thrombocytosis, unspecified: Secondary | ICD-10-CM

## 2022-01-31 NOTE — Patient Instructions (Signed)
It was great to see you!  We are checking your labs today and will let you know the results via mychart/phone.   We are also checking an x-ray on your back.   Let's follow-up based on the lab results.   Take care,  Vance Peper, NP

## 2022-01-31 NOTE — Assessment & Plan Note (Signed)
She has not had a headache since last visit and she didn't start the propanolol. She has the medication in case her headaches come back again. Follow-up with any concerns.

## 2022-01-31 NOTE — Assessment & Plan Note (Signed)
Hemoglobin and platelets were elevated on prior labs. Will check CBC, iron panel, and CMP today.

## 2022-01-31 NOTE — Assessment & Plan Note (Signed)
Chronic, ongoing. Pain only occurs when she is laying down at night. Will check x-ray of lumbar spine today. She also has a follow-up appointment with GYN in August for fibroids. Continue using heat as needed. Follow-up based on results.

## 2022-02-01 ENCOUNTER — Encounter: Payer: Self-pay | Admitting: Nurse Practitioner

## 2022-02-01 ENCOUNTER — Ambulatory Visit (INDEPENDENT_AMBULATORY_CARE_PROVIDER_SITE_OTHER): Payer: BC Managed Care – PPO

## 2022-02-01 ENCOUNTER — Telehealth: Payer: Self-pay | Admitting: Oncology

## 2022-02-01 DIAGNOSIS — M545 Low back pain, unspecified: Secondary | ICD-10-CM

## 2022-02-01 DIAGNOSIS — G8929 Other chronic pain: Secondary | ICD-10-CM | POA: Diagnosis not present

## 2022-02-01 LAB — CBC WITH DIFFERENTIAL/PLATELET
Basophils Absolute: 0 10*3/uL (ref 0.0–0.1)
Basophils Relative: 0.5 % (ref 0.0–3.0)
Eosinophils Absolute: 0.1 10*3/uL (ref 0.0–0.7)
Eosinophils Relative: 1.1 % (ref 0.0–5.0)
HCT: 45.9 % (ref 36.0–46.0)
Hemoglobin: 15.4 g/dL — ABNORMAL HIGH (ref 12.0–15.0)
Lymphocytes Relative: 39.5 % (ref 12.0–46.0)
Lymphs Abs: 2.2 10*3/uL (ref 0.7–4.0)
MCHC: 33.6 g/dL (ref 30.0–36.0)
MCV: 89.4 fl (ref 78.0–100.0)
Monocytes Absolute: 0.5 10*3/uL (ref 0.1–1.0)
Monocytes Relative: 8.2 % (ref 3.0–12.0)
Neutro Abs: 2.9 10*3/uL (ref 1.4–7.7)
Neutrophils Relative %: 50.7 % (ref 43.0–77.0)
Platelets: 584 10*3/uL — ABNORMAL HIGH (ref 150.0–400.0)
RBC: 5.14 Mil/uL — ABNORMAL HIGH (ref 3.87–5.11)
RDW: 14.5 % (ref 11.5–15.5)
WBC: 5.6 10*3/uL (ref 4.0–10.5)

## 2022-02-01 LAB — IRON,TIBC AND FERRITIN PANEL
%SAT: 37 % (calc) (ref 16–45)
Ferritin: 23 ng/mL (ref 16–232)
Iron: 129 ug/dL (ref 40–190)
TIBC: 348 mcg/dL (calc) (ref 250–450)

## 2022-02-01 LAB — COMPREHENSIVE METABOLIC PANEL
ALT: 11 U/L (ref 0–35)
AST: 21 U/L (ref 0–37)
Albumin: 4.4 g/dL (ref 3.5–5.2)
Alkaline Phosphatase: 32 U/L — ABNORMAL LOW (ref 39–117)
BUN: 11 mg/dL (ref 6–23)
CO2: 23 mEq/L (ref 19–32)
Calcium: 9.4 mg/dL (ref 8.4–10.5)
Chloride: 102 mEq/L (ref 96–112)
Creatinine, Ser: 0.9 mg/dL (ref 0.40–1.20)
GFR: 78.07 mL/min (ref >60.00)
Glucose, Bld: 73 mg/dL (ref 70–99)
Potassium: 4.2 mEq/L (ref 3.5–5.1)
Sodium: 136 mEq/L (ref 135–145)
Total Bilirubin: 1.1 mg/dL (ref 0.2–1.2)
Total Protein: 7.7 g/dL (ref 6.0–8.3)

## 2022-02-01 LAB — LIPID PANEL
Cholesterol: 123 mg/dL (ref 0–200)
HDL: 44.1 mg/dL (ref >39.00)
LDL Cholesterol: 72 mg/dL (ref 0–99)
NonHDL: 79.33
Total CHOL/HDL Ratio: 3
Triglycerides: 37 mg/dL (ref 0.0–149.0)
VLDL: 7.4 mg/dL (ref 0.0–40.0)

## 2022-02-01 NOTE — Progress Notes (Unsigned)
Initial visit for ongoing low back pain that radiates around to the front of her hips. She states that it only happens when she lays down and is sleeping at night. When she is up during the day, she doesn't have any pain. She has a history of fibroids and is not sure if this is causing her pain.

## 2022-02-01 NOTE — Addendum Note (Signed)
Addended by: Vance Peper A on: 02/01/2022 11:05 AM   Modules accepted: Orders

## 2022-02-01 NOTE — Telephone Encounter (Signed)
Scheduled appt per 6/16 referral. Pt is aware of appt date and time. Pt is aware to arrive 15 mins prior to appt time and to bring and updated insurance card. Pt is aware of appt location.   

## 2022-02-14 ENCOUNTER — Telehealth: Payer: Self-pay | Admitting: Nurse Practitioner

## 2022-02-21 ENCOUNTER — Encounter: Payer: Self-pay | Admitting: Nurse Practitioner

## 2022-02-21 NOTE — Telephone Encounter (Signed)
Called and spoke to pt, no refill needed. Sw, cma

## 2022-02-21 NOTE — Telephone Encounter (Signed)
Pt would like a call back concerning this issue.

## 2022-02-22 ENCOUNTER — Other Ambulatory Visit: Payer: Self-pay

## 2022-02-22 ENCOUNTER — Inpatient Hospital Stay: Payer: BC Managed Care – PPO | Attending: Oncology | Admitting: Oncology

## 2022-02-22 ENCOUNTER — Inpatient Hospital Stay: Payer: BC Managed Care – PPO

## 2022-02-22 VITALS — BP 125/86 | HR 85 | Temp 97.6°F | Resp 17 | Ht 68.0 in | Wt 188.6 lb

## 2022-02-22 DIAGNOSIS — D751 Secondary polycythemia: Secondary | ICD-10-CM

## 2022-02-22 DIAGNOSIS — D45 Polycythemia vera: Secondary | ICD-10-CM

## 2022-02-22 LAB — CBC WITH DIFFERENTIAL (CANCER CENTER ONLY)
Abs Immature Granulocytes: 0.01 10*3/uL (ref 0.00–0.07)
Basophils Absolute: 0 10*3/uL (ref 0.0–0.1)
Basophils Relative: 0 %
Eosinophils Absolute: 0.1 10*3/uL (ref 0.0–0.5)
Eosinophils Relative: 1 %
HCT: 47.4 % — ABNORMAL HIGH (ref 36.0–46.0)
Hemoglobin: 16.2 g/dL — ABNORMAL HIGH (ref 12.0–15.0)
Immature Granulocytes: 0 %
Lymphocytes Relative: 32 %
Lymphs Abs: 1.7 10*3/uL (ref 0.7–4.0)
MCH: 30.1 pg (ref 26.0–34.0)
MCHC: 34.2 g/dL (ref 30.0–36.0)
MCV: 88.1 fL (ref 80.0–100.0)
Monocytes Absolute: 0.5 10*3/uL (ref 0.1–1.0)
Monocytes Relative: 9 %
Neutro Abs: 3 10*3/uL (ref 1.7–7.7)
Neutrophils Relative %: 58 %
Platelet Count: 494 10*3/uL — ABNORMAL HIGH (ref 150–400)
RBC: 5.38 MIL/uL — ABNORMAL HIGH (ref 3.87–5.11)
RDW: 13.6 % (ref 11.5–15.5)
WBC Count: 5.1 10*3/uL (ref 4.0–10.5)
nRBC: 0 % (ref 0.0–0.2)

## 2022-02-22 NOTE — Progress Notes (Signed)
Reason for the request:    Polycythemia  HPI: I was asked by Linda Ross to evaluate Linda Ross for elevated hemoglobin and platelet count.  She is a 46 year old woman with history of migraine headaches who was found to have elevated counts on a CBC on January 31, 2022.  Her hemoglobin was 15.4 and a platelet count of 584.  Her MCV was normal at 89.4 with normal white cell count.  Iron level was normal at 129 with ferritin of 23.  Electrolytes and liver function test as well as kidney function test all within normal range.  Previous CBC obtained on September 02, 2021 showed a hemoglobin of 16.1 and platelet count of 542.  Previous CBC in 2017 showed a hemoglobin of 15.1 but normal platelet count.  Clinically, she reports feeling well without any major complaints.  She does report periodic migraine headaches but no other complaints.  She denies chest pain, shortness of breath or palpitations.  She denies any hospitalizations or illnesses.   She does not report any headaches, blurry vision, syncope or seizures. Does not report any fevers, chills or sweats.  Does not report any cough, wheezing or hemoptysis.  Does not report any chest pain, palpitation, orthopnea or leg edema.  Does not report any nausea, vomiting or abdominal pain.  Does not report any constipation or diarrhea.  Does not report any skeletal complaints.    Does not report frequency, urgency or hematuria.  Does not report any skin rashes or lesions. Does not report any heat or cold intolerance.  Does not report any lymphadenopathy or petechiae.  Does not report any anxiety or depression.  Remaining review of systems is negative.     Past Medical History:  Diagnosis Date   Chronic back pain    PCL injury    Pre-diabetes    Prediabetes   :   Past Surgical History:  Procedure Laterality Date   WISDOM TOOTH EXTRACTION    :   Current Outpatient Medications:    acetaminophen (TYLENOL) 500 MG tablet, Take 500 mg by mouth every  6 (six) hours as needed., Disp: , Rfl:    cetirizine (ZYRTEC) 10 MG tablet, cetirizine 10 mg tablet  TAKE 1 TABLET BY MOUTH EVERY DAY FOR 7 DAYS, Disp: , Rfl:    Cholecalciferol (VITAMIN D3) 1.25 MG (50000 UT) CAPS, Take 50,000 Units by mouth once a week., Disp: , Rfl:    ibuprofen (ADVIL) 600 MG tablet, ibuprofen 600 mg tablet, Disp: , Rfl:    Multiple Vitamins-Minerals (PX COMPLETE SENIOR MULTIVITS) TABS, Take 1 tablet by mouth daily., Disp: , Rfl:    Omega-3 1000 MG CAPS, Take 1,000 mg by mouth daily., Disp: , Rfl:    propranolol ER (INDERAL LA) 60 MG 24 hr capsule, Take 1 capsule (60 mg total) by mouth daily., Disp: 30 capsule, Rfl: 1   selenium 50 MCG TABS tablet, Take 50 mcg by mouth daily., Disp: , Rfl:    vitamin B-12 (CYANOCOBALAMIN) 1000 MCG tablet, Take 1,000 mcg by mouth daily., Disp: , Rfl:    zinc gluconate 50 MG tablet, Take 50 mg by mouth daily., Disp: , Rfl: :   Allergies  Allergen Reactions   Guaifenesin & Derivatives Other (See Comments)    headache  :   Family History  Problem Relation Age of Onset   Hypertension Mother    Cancer Father        prostate   Hypertension Father    Heart failure Father  Diabetes Father    Kidney failure Father   :   Social History   Socioeconomic History   Marital status: Married    Spouse name: Not on file   Number of children: Not on file   Years of education: Not on file   Highest education level: Not on file  Occupational History   Not on file  Tobacco Use   Smoking status: Never   Smokeless tobacco: Never  Vaping Use   Vaping Use: Never used  Substance and Sexual Activity   Alcohol use: No   Drug use: No   Sexual activity: Not on file  Other Topics Concern   Not on file  Social History Narrative   Not on file   Social Determinants of Health   Financial Resource Strain: Not on file  Food Insecurity: Not on file  Transportation Needs: Not on file  Physical Activity: Not on file  Stress: Not on file   Social Connections: Not on file  Intimate Partner Violence: Not on file  :  Pertinent items are noted in HPI.  Exam: Blood pressure 125/86, pulse 85, temperature 97.6 F (36.4 C), temperature source Temporal, resp. rate 17, height '5\' 8"'$  (1.727 m), weight 188 lb 9.6 oz (85.5 kg), SpO2 100 %. ECOG 1 General appearance: alert and cooperative appeared without distress. Head: atraumatic without any abnormalities. Eyes: conjunctivae/corneas clear. PERRL.  Sclera anicteric. Throat: lips, mucosa, and tongue normal; without oral thrush or ulcers. Resp: clear to auscultation bilaterally without rhonchi, wheezes or dullness to percussion. Cardio: regular rate and rhythm, S1, S2 normal, no murmur, click, rub or gallop GI: soft, non-tender; bowel sounds normal; no masses,  no organomegaly Skin: Skin color, texture, turgor normal. No rashes or lesions Lymph nodes: Cervical, supraclavicular, and axillary nodes normal. Neurologic: Grossly normal without any motor, sensory or deep tendon reflexes. Musculoskeletal: No joint deformity or effusion.   DG Lumbar Spine Complete  Result Date: 02/01/2022 CLINICAL DATA:  Chronic low back pain EXAM: LUMBAR SPINE - COMPLETE 4+ VIEW COMPARISON:  None Available. FINDINGS: No fracture or spondylolisthesis identified. No spondylolysis. Mild intervertebral disc space narrowing and facet arthropathy at L4-L5 and L5-S1. IMPRESSION: Mild degenerative changes. Electronically Signed   By: Ofilia Neas M.D.   On: 02/01/2022 13:37    Assessment and Plan:   46 year old with:  1.  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  Hemoglobin is 15.4 with a platelet count of 584.  She has normal white cell count and asymptomatic.  The natural course of these findings and the differential diagnosis was discussed today in detail.  Secondary causes including reactive findings related to respiratory issues as well as hormone replacement are  consideration.  Myeloproliferative disorder could also be a consideration at this time.  Polycythemia vera or essential thrombocythemia.  From a management standpoint, I recommended obtaining molecular panel including JAK2 mutation among other myeloproliferative disorder mutations.  She does not require any intervention at this time.  She does not require any phlebotomy or cytoreductive therapy.  I recommended continued monitoring for the time being.  2.  Thrombosis prophylaxis: She is at low risk of thrombosis at this time.  3.  Follow-up: Will be in 6 months for repeat evaluation.  45  minutes were dedicated to this visit. The time was spent on reviewing laboratory data, discussing treatment options, discussing differential diagnosis and answering questions regarding future plan.     A copy of this consult has been forwarded to the requesting  provider

## 2022-02-24 LAB — ERYTHROPOIETIN: Erythropoietin: 7.7 m[IU]/mL (ref 2.6–18.5)

## 2022-03-04 ENCOUNTER — Encounter: Payer: Self-pay | Admitting: Oncology

## 2022-03-04 LAB — JAK2 (INCLUDING V617F AND EXON 12), MPL,& CALR-NEXT GEN SEQ

## 2022-03-15 ENCOUNTER — Encounter (HOSPITAL_BASED_OUTPATIENT_CLINIC_OR_DEPARTMENT_OTHER): Payer: Self-pay | Admitting: Obstetrics & Gynecology

## 2022-03-15 ENCOUNTER — Other Ambulatory Visit (HOSPITAL_BASED_OUTPATIENT_CLINIC_OR_DEPARTMENT_OTHER): Payer: Self-pay | Admitting: Obstetrics & Gynecology

## 2022-03-15 MED ORDER — SULFAMETHOXAZOLE-TRIMETHOPRIM 800-160 MG PO TABS: 1.0000 | ORAL_TABLET | Freq: Two times a day (BID) | ORAL | 0 refills | Status: DC

## 2022-03-15 MED ORDER — FLUCONAZOLE 150 MG PO TABS: 150.0000 mg | ORAL_TABLET | Freq: Once | ORAL | 0 refills | Status: AC

## 2022-03-15 NOTE — Progress Notes (Signed)
Pt called on call provider with complaint of dysuria with blood in her urine x 2 this afternoon.  Has hx of UTIs.  Non recently.  Is not on her menstrual cycle.  Rx for bactrim DS bid x 3 days and Diflucan '150mg'$  po x 1 sent to pharmacy.  Precautions given for calling back if no improvement in symptoms in 24 hours.

## 2022-04-11 ENCOUNTER — Encounter: Payer: Self-pay | Admitting: Nurse Practitioner

## 2022-05-14 DIAGNOSIS — Z01419 Encounter for gynecological examination (general) (routine) without abnormal findings: Secondary | ICD-10-CM | POA: Diagnosis not present

## 2022-05-14 DIAGNOSIS — M545 Low back pain, unspecified: Secondary | ICD-10-CM | POA: Diagnosis not present

## 2022-05-14 DIAGNOSIS — D259 Leiomyoma of uterus, unspecified: Secondary | ICD-10-CM | POA: Diagnosis not present

## 2022-05-14 DIAGNOSIS — Z1231 Encounter for screening mammogram for malignant neoplasm of breast: Secondary | ICD-10-CM | POA: Diagnosis not present

## 2022-05-14 DIAGNOSIS — D45 Polycythemia vera: Secondary | ICD-10-CM | POA: Diagnosis not present

## 2022-05-14 LAB — HM MAMMOGRAPHY

## 2022-05-30 DIAGNOSIS — M545 Low back pain, unspecified: Secondary | ICD-10-CM | POA: Diagnosis not present

## 2022-05-30 DIAGNOSIS — D259 Leiomyoma of uterus, unspecified: Secondary | ICD-10-CM | POA: Diagnosis not present

## 2022-05-30 DIAGNOSIS — L259 Unspecified contact dermatitis, unspecified cause: Secondary | ICD-10-CM | POA: Diagnosis not present

## 2022-06-25 ENCOUNTER — Telehealth: Payer: Self-pay | Admitting: Nurse Practitioner

## 2022-06-26 ENCOUNTER — Encounter: Payer: Self-pay | Admitting: Nurse Practitioner

## 2022-06-26 ENCOUNTER — Ambulatory Visit (INDEPENDENT_AMBULATORY_CARE_PROVIDER_SITE_OTHER): Payer: BC Managed Care – PPO | Admitting: Nurse Practitioner

## 2022-06-26 VITALS — BP 112/80 | HR 82 | Temp 96.4°F | Ht 68.0 in | Wt 189.6 lb

## 2022-06-26 DIAGNOSIS — M25512 Pain in left shoulder: Secondary | ICD-10-CM | POA: Diagnosis not present

## 2022-06-26 DIAGNOSIS — G8929 Other chronic pain: Secondary | ICD-10-CM

## 2022-06-26 DIAGNOSIS — R197 Diarrhea, unspecified: Secondary | ICD-10-CM | POA: Diagnosis not present

## 2022-06-26 NOTE — Assessment & Plan Note (Signed)
She has been having intermittent left shoulder pain the last few days during the week.  This has been ongoing for the last 2 months.  She denies any injury.  She does have some limited range of motion to her left shoulder.  She declines x-ray today.  Most likely arthritis.  She can continue Tylenol as needed for pain.  She can also use heat/ice, lidocaine patches.  Discussed exercises to do daily.  Follow-up if symptoms worsen or do not improve.

## 2022-06-26 NOTE — Progress Notes (Signed)
Acute Office Visit  Subjective:     Patient ID: Linda Ross, female    DOB: 25-Nov-1975, 46 y.o.   MRN: [id number]  Chief Complaint  Patient presents with   Acute Visit    C/o diarrhea x 72 hours  Along with fever  Left shoulder pain  Requesting records for pap    HPI Patient is in today for diarrhea since Saturday.  She does not remember eating anything new or eating out.  She has been experiencing some abdominal cramping and upper abdominal pain.  She had some nausea on Saturday and Sunday, however that has resolved.  She states that her diarrhea is starting to get slightly better.  She did not take any medication for this.  She is interested in having some stool test to see if there is any bacteria in her stool.  She also endorses fever on Saturday.  She denies recent travel, blood in her stool.  She has also been experiencing left intermittent shoulder pain.  She states this has been an ongoing about once a week for the last 2 months.  She does not remember any injury.  She has been taking Tylenol as needed when the pain is flaring.  The pain radiates up to her upper back.  ROS See pertinent positives and negatives per HPI.     Objective:    BP 112/80 (BP Location: Right Arm, Patient Position: Sitting, Cuff Size: Normal)   Pulse 82   Temp (!) 96.4 F (35.8 C) (Temporal)   Ht '5\' 8"'$  (1.727 m)   Wt 189 lb 9.6 oz (86 kg)   SpO2 96%   BMI 28.83 kg/m    Physical Exam Vitals and nursing note reviewed.  Constitutional:      General: She is not in acute distress.    Appearance: Normal appearance.  HENT:     Head: Normocephalic.  Eyes:     Conjunctiva/sclera: Conjunctivae normal.  Cardiovascular:     Rate and Rhythm: Normal rate and regular rhythm.     Pulses: Normal pulses.     Heart sounds: Normal heart sounds.  Pulmonary:     Effort: Pulmonary effort is normal.     Breath sounds: Normal breath sounds.  Abdominal:     General: There is no distension.      Palpations: Abdomen is soft.     Tenderness: There is no abdominal tenderness. There is no guarding.     Hernia: No hernia is present.  Musculoskeletal:        General: No tenderness.     Cervical back: Normal range of motion.     Comments: Limited ROM to left shoulder, equal grip, strength left arm 5/5. Empty can test negative.   Skin:    General: Skin is warm.  Neurological:     General: No focal deficit present.     Mental Status: She is alert and oriented to person, place, and time.  Psychiatric:        Mood and Affect: Mood normal.        Behavior: Behavior normal.        Thought Content: Thought content normal.        Judgment: Judgment normal.     Results for orders placed or performed in visit on 06/26/22  HM PAP SMEAR  Result Value Ref Range   HM Pap smear normal   Results Console HPV  Result Value Ref Range   CHL HPV Negative  Assessment & Plan:   Problem List Items Addressed This Visit       Other   Chronic left shoulder pain    She has been having intermittent left shoulder pain the last few days during the week.  This has been ongoing for the last 2 months.  She denies any injury.  She does have some limited range of motion to her left shoulder.  She declines x-ray today.  Most likely arthritis.  She can continue Tylenol as needed for pain.  She can also use heat/ice, lidocaine patches.  Discussed exercises to do daily.  Follow-up if symptoms worsen or do not improve.      Other Visit Diagnoses     Diarrhea, unspecified type    -  Primary   Started 5 days ago. Most likely viral gastroenteritis, however she would like stool testing. Check for c-diff, O&P, and culture. Encouraged fluids, bland foods.   Relevant Orders   Cdiff NAA+O+P+Stool Culture       No orders of the defined types were placed in this encounter.   Return if symptoms worsen or fail to improve.  Charyl Dancer, NP

## 2022-06-26 NOTE — Patient Instructions (Signed)
It was great to see you!  I have ordered stool studies for you to bring back Monday-Friday 8-5pm   Try to eat bland foods until your stomach fully improves  You can keep taking tylenol as needed, using the heating pad on your shoulder. Try doing exercises like spelling the alphabet with your arm You can also try lidocaine patches.   Let's follow-up if your symptoms worsen or any concerns.   Take care,  Vance Peper, NP

## 2022-06-27 NOTE — Telephone Encounter (Signed)
error 

## 2022-08-23 ENCOUNTER — Inpatient Hospital Stay (HOSPITAL_BASED_OUTPATIENT_CLINIC_OR_DEPARTMENT_OTHER): Payer: BC Managed Care – PPO | Admitting: Oncology

## 2022-08-23 ENCOUNTER — Other Ambulatory Visit: Payer: Self-pay

## 2022-08-23 ENCOUNTER — Inpatient Hospital Stay: Payer: BC Managed Care – PPO | Attending: Oncology

## 2022-08-23 VITALS — BP 121/78 | HR 78 | Temp 97.9°F | Resp 17 | Wt 193.3 lb

## 2022-08-23 DIAGNOSIS — D751 Secondary polycythemia: Secondary | ICD-10-CM | POA: Diagnosis not present

## 2022-08-23 DIAGNOSIS — D45 Polycythemia vera: Secondary | ICD-10-CM

## 2022-08-23 LAB — CBC WITH DIFFERENTIAL (CANCER CENTER ONLY)
Abs Immature Granulocytes: 0.01 10*3/uL (ref 0.00–0.07)
Basophils Absolute: 0 10*3/uL (ref 0.0–0.1)
Basophils Relative: 0 %
Eosinophils Absolute: 0.1 10*3/uL (ref 0.0–0.5)
Eosinophils Relative: 3 %
HCT: 46.7 % — ABNORMAL HIGH (ref 36.0–46.0)
Hemoglobin: 16 g/dL — ABNORMAL HIGH (ref 12.0–15.0)
Immature Granulocytes: 0 %
Lymphocytes Relative: 41 %
Lymphs Abs: 2 10*3/uL (ref 0.7–4.0)
MCH: 30.5 pg (ref 26.0–34.0)
MCHC: 34.3 g/dL (ref 30.0–36.0)
MCV: 89.1 fL (ref 80.0–100.0)
Monocytes Absolute: 0.4 10*3/uL (ref 0.1–1.0)
Monocytes Relative: 8 %
Neutro Abs: 2.4 10*3/uL (ref 1.7–7.7)
Neutrophils Relative %: 48 %
Platelet Count: 458 10*3/uL — ABNORMAL HIGH (ref 150–400)
RBC: 5.24 MIL/uL — ABNORMAL HIGH (ref 3.87–5.11)
RDW: 13.7 % (ref 11.5–15.5)
WBC Count: 5 10*3/uL (ref 4.0–10.5)
nRBC: 0 % (ref 0.0–0.2)

## 2022-08-23 NOTE — Progress Notes (Signed)
Hematology and Oncology Follow Up Visit  Linda Ross [id number] 04-Jul-1976 47 y.o. 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     Current therapy: Under evaluation for therapy.   Interim History: [name] returns today for a follow-up visit.  Since the last visit, she reports no major complaints or changes in her health.  She does report some abdominal fullness and pelvic discomfort related to uterine fibroids.  She denies any nausea vomiting or arthralgias.  She denies any thrombosis or bleeding episodes.  Her menstrual cycles remain regular with 3 heavy days of bleeding and 2 light days.  She denies any headaches, blurry vision or chest pain.  She denies any shortness of breath.     Medications: I have reviewed the patient's current medications.  Current Outpatient Medications  Medication Sig Dispense Refill   acetaminophen (TYLENOL) 500 MG tablet Take 500 mg by mouth every 6 (six) hours as needed.     cetirizine (ZYRTEC) 10 MG tablet cetirizine 10 mg tablet  TAKE 1 TABLET BY MOUTH EVERY DAY FOR 7 DAYS     Cholecalciferol (VITAMIN D3) 1.25 MG (50000 UT) CAPS Take 50,000 Units by mouth once a week.     Multiple Vitamins-Minerals (PX COMPLETE SENIOR MULTIVITS) TABS Take 1 tablet by mouth daily.     Omega-3 1000 MG CAPS Take 1,000 mg by mouth daily.     selenium 50 MCG TABS tablet Take 50 mcg by mouth daily.     vitamin B-12 (CYANOCOBALAMIN) 1000 MCG tablet Take 1,000 mcg by mouth daily.     zinc gluconate 50 MG tablet Take 50 mg by mouth daily.     No current facility-administered medications for this visit.     Allergies:  Allergies  Allergen Reactions   Guaifenesin & Derivatives Other (See Comments)    headache      Physical Exam: Blood pressure  121/78, pulse 78, temperature 97.9 F (36.6 C), temperature source Temporal, resp. rate 17, weight 193 lb 4.8 oz (87.7 kg), SpO2 100 %.  ECOG: 1    General appearance: Comfortable appearing without any discomfort Head: Normocephalic without any trauma Oropharynx: Mucous membranes are moist and pink without any thrush or ulcers. Eyes: Pupils are equal and round reactive to light. Lymph nodes: No cervical, supraclavicular, inguinal or axillary lymphadenopathy.   Heart:regular rate and rhythm.  S1 and S2 without leg edema. Lung: Clear without any rhonchi or wheezes.  No dullness to percussion. Abdomin: Soft, nontender, nondistended with good bowel sounds.  No hepatosplenomegaly. Musculoskeletal: No joint deformity or effusion.  Full range of motion noted. Neurological: No deficits noted on motor, sensory and deep tendon reflex exam. Skin: No petechial rash or dryness.  Appeared moist.      Lab Results: Lab Results  Component Value Date   WBC 5.1 02/22/2022   HGB 16.2 (H) 02/22/2022   HCT 47.4 (H) 02/22/2022   MCV 88.1 02/22/2022   PLT 494 (H) 02/22/2022     Chemistry      Component Value Date/Time   NA 136 01/31/2022 1549   K 4.2 01/31/2022 1549   CL 102 01/31/2022 1549   CO2 23 01/31/2022 1549   BUN 11 01/31/2022 1549   CREATININE 0.90 01/31/2022 1549      Component Value Date/Time   CALCIUM 9.4 01/31/2022 1549  ALKPHOS 32 (L) 01/31/2022 1549   AST 21 01/31/2022 1549   ALT 11 01/31/2022 1549   BILITOT 1.1 01/31/2022 1549          Impression and Plan:   47 year old with:  1.  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  She was found to have mild erythrocytosis in addition to thrombocytosis.   Treatment options at this time were discussed.  Continued observation versus phlebotomy options were discussed.  Hydroxyurea could also be considered as cytoreductive approach.  Laboratory data from today reviewed and showed a hemoglobin of 16 which has been  stable in the last year.  Given the fact that she still having menstrual cycles and uterine fibroids, her hemoglobin is certainly higher than desired goal with likely hematocrit goal of less than 40.  Risks and benefits of therapeutic phlebotomy versus cytoreductive therapy versus observation were debated.  Given her young age and the fact that she is asymptomatic we have opted to defer therapeutic phlebotomy for the time being.  I recommended strict monitoring at this time and repeat her counts in 4 months.  Instituting phlebotomy would be considered at that time if she develops any symptoms or if she has further increase in her hemoglobin.  Given her bleeding and uterine fibroids it is also reasonable to hold off on the phlebotomy and the possibility that she might develop worsening iron deficiency related to that which could be exacerbated by phlebotomy.  In the meantime, I have recommended adequate hydration and monitoring of her symptoms.  Blood donation was also recommended at this time.   2.  Thrombosis prophylaxis: Thrombosis risk is low at this time given her age and low white cell count.   3.  Follow-up: In 4 months to repeat her counts.   30  minutes were spent on this encounter.  The time was dedicated to updating her disease status, reviewing laboratory data, reviewing management choices and future plan of care discussion.   Zola Button, MD 1/5/20241:03 PM

## 2022-09-23 ENCOUNTER — Encounter: Payer: Self-pay | Admitting: Nurse Practitioner

## 2022-09-27 ENCOUNTER — Encounter: Payer: Self-pay | Admitting: Nurse Practitioner

## 2022-09-27 ENCOUNTER — Ambulatory Visit (INDEPENDENT_AMBULATORY_CARE_PROVIDER_SITE_OTHER): Payer: BC Managed Care – PPO | Admitting: Nurse Practitioner

## 2022-09-27 VITALS — BP 120/82 | HR 76 | Temp 98.1°F | Resp 16 | Ht 68.0 in | Wt 196.0 lb

## 2022-09-27 DIAGNOSIS — R0789 Other chest pain: Secondary | ICD-10-CM

## 2022-09-27 NOTE — Progress Notes (Signed)
Established Patient Visit  Patient: Linda Ross   DOB: [birth year]   47 y.o. Female  MRN: [id number] Visit Date: 09/27/2022  Subjective:    Chief Complaint  Patient presents with   Office visit     Pt states" Linda Ross doing ok   HPI Linda Ross presents with Intermittent mid chest pain-tightness, onset 1year ago, occurs at bedtime and associated with coughing and choking sensation. she had 3episodes so far with last one on Monday. Each episode last several hours about she was unable to return to sleep. Unsure if related to any specific sleeping position. She reports snoring at bedtime per husband, no reported apneic episodes. She denies any palpitations, dizziness, paresthesia, or weakness or nausea or ABD pain. No Hx of GERD, no chest wall injury or repetitive lifting/pulling/pushing Exercise routine: riding bicycle 3x/week 55mns each day, no symptoms during exercise LMP 09/13/2022, no contraception. No recent travel. No ETOH or tobacco or illicit drug use No Fhx of premature CAD or PE. Treadmill stress test completed 07/2020: normal ECG 08/2021: normal.  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    Latest Ref Rng & Units 08/23/2022    1:17 PM 02/22/2022   11:41 AM 01/31/2022    3:49 PM  CBC  WBC 4.0 - 10.5 K/uL 5.0  5.1  5.6   Hemoglobin 12.0 - 15.0 g/dL 16.0  16.2  15.4   Hematocrit 36.0 - 46.0 % 46.7  47.4  45.9   Platelets 150 - 400 K/uL 458  494  584.0     BP Readings from Last 3 Encounters:  09/27/22 120/82  08/23/22 121/78  06/26/22 112/80    Reviewed medical, surgical, and social history today  Medications: Outpatient Medications Prior to Visit  Medication Sig   acetaminophen (TYLENOL) 500 MG tablet Take 500 mg by mouth every 6 (six) hours as needed.   Cholecalciferol (VITAMIN D3) 1.25 MG (50000 UT) CAPS Take 50,000 Units by mouth once a week.   Multiple Vitamins-Minerals (PX COMPLETE SENIOR  MULTIVITS) TABS Take 1 tablet by mouth daily.   Omega-3 1000 MG CAPS Take 1,000 mg by mouth daily.   vitamin B-12 (CYANOCOBALAMIN) 1000 MCG tablet Take 1,000 mcg by mouth daily.   cetirizine (ZYRTEC) 10 MG tablet cetirizine 10 mg tablet  TAKE 1 TABLET BY MOUTH EVERY DAY FOR 7 DAYS (Patient not taking: Reported on 09/27/2022)   selenium 50 MCG TABS tablet Take 50 mcg by mouth daily. (Patient not taking: Reported on 09/27/2022)   zinc gluconate 50 MG tablet Take 50 mg by mouth daily. (Patient not taking: Reported on 09/27/2022)   No facility-administered medications prior to visit.   Reviewed past medical and social history.   ROS per HPI above  Last CBC Lab Results  Component Value Date   WBC 5.0 08/23/2022   HGB 16.0 (H) 08/23/2022   HCT 46.7 (H) 08/23/2022   MCV 89.1 08/23/2022   MCH 30.5 08/23/2022   RDW 13.7 08/23/2022   PLT 458 (H) [id number] Last metabolic panel Lab Results  Component Value Date   GLUCOSE 73 01/31/2022   NA 136 01/31/2022   K 4.2 01/31/2022   CL 102 01/31/2022   CO2 23 01/31/2022   BUN 11 01/31/2022   CREATININE 0.90 01/31/2022   GFRNONAA >60 09/02/2021   CALCIUM 9.4 01/31/2022   PROT 7.7 01/31/2022   ALBUMIN 4.4 01/31/2022  BILITOT 1.1 01/31/2022   ALKPHOS 32 (L) 01/31/2022   AST 21 01/31/2022   ALT 11 01/31/2022   ANIONGAP 10 09/02/2021      Objective:  BP 120/82 (BP Location: Left Arm, Patient Position: Sitting, Cuff Size: Normal)   Pulse 76   Temp 98.1 F (36.7 C) (Temporal)   Resp 16   Ht 5' 8"$  (1.727 m)   Wt 196 lb (88.9 kg)   LMP 09/13/2022 (Approximate)   SpO2 96%   BMI 29.80 kg/m      Physical Exam Vitals reviewed.  Constitutional:      General: She is not in acute distress. Cardiovascular:     Rate and Rhythm: Normal rate and regular rhythm.     Pulses: Normal pulses.     Heart sounds: Normal heart sounds.  Pulmonary:     Effort: Pulmonary effort is normal.     Breath sounds: Normal breath sounds.  Chest:     Chest  wall: No tenderness.  Musculoskeletal:     Right lower leg: No edema.     Left lower leg: No edema.  Neurological:     Mental Status: She is alert and oriented to person, place, and time.     No results found for any visits on 09/27/22.    Assessment & Plan:    Problem List Items Addressed This Visit   None Visit Diagnoses     Atypical chest pain    -  Primary   Relevant Orders   Ambulatory referral to Cardiology     Symptoms possibly related to apneic episode vs GERD?. I have low suspicion of cardiac etiology. Referral to cardiology entered per patient's request.  Return if symptoms worsen or fail to improve.     Wilfred Lacy, NP

## 2022-09-27 NOTE — Patient Instructions (Signed)
You will be contacted to schedule appt with cardiology  Nonspecific Chest Pain Chest pain can be caused by many different conditions. Some causes of chest pain can be life-threatening. These will require treatment right away. Serious causes of chest pain include: Heart attack. A tear in the body's main blood vessel. Redness and swelling (inflammation) around your heart. Blood clot in your lungs. Other causes of chest pain may not be so serious. These include: Heartburn. Anxiety or stress. Damage to bones or muscles in your chest. Lung infections. Chest pain can feel like: Pain or discomfort in your chest. Crushing, pressure, aching, or squeezing pain. Burning or tingling. Dull or sharp pain that is worse when you move, cough, or take a deep breath. Pain or discomfort that is also felt in your back, neck, jaw, shoulder, or arm, or pain that spreads to any of these areas. It is hard to know whether your pain is caused by something that is serious or something that is not so serious. So it is important to see your doctor right away if you have chest pain. Follow these instructions at home: Medicines Take over-the-counter and prescription medicines only as told by your doctor. If you were prescribed an antibiotic medicine, take it as told by your doctor. Do not stop taking the antibiotic even if you start to feel better. Lifestyle  Rest as told by your doctor. Do not use any products that contain nicotine or tobacco, such as cigarettes, e-cigarettes, and chewing tobacco. If you need help quitting, ask your doctor. Do not drink alcohol. Make lifestyle changes as told by your doctor. These may include: Getting regular exercise. Ask your doctor what activities are safe for you. Eating a heart-healthy diet. A diet and nutrition specialist (dietitian) can help you to learn healthy eating options. Staying at a healthy weight. Treating diabetes or high blood pressure, if needed. Lowering your  stress. Activities such as yoga and relaxation techniques can help. General instructions Pay attention to any changes in your symptoms. Tell your doctor about them or any new symptoms. Avoid any activities that cause chest pain. Keep all follow-up visits as told by your doctor. This is important. You may need more testing if your chest pain does not go away. Contact a doctor if: Your chest pain does not go away. You feel depressed. You have a fever. Get help right away if: Your chest pain is worse. You have a cough that gets worse, or you cough up blood. You have very bad (severe) pain in your belly (abdomen). You pass out (faint). You have either of these for no clear reason: Sudden chest discomfort. Sudden discomfort in your arms, back, neck, or jaw. You have shortness of breath at any time. You suddenly start to sweat, or your skin gets clammy. You feel sick to your stomach (nauseous). You throw up (vomit). You suddenly feel lightheaded or dizzy. You feel very weak or tired. Your heart starts to beat fast, or it feels like it is skipping beats. These symptoms may be an emergency. Do not wait to see if the symptoms will go away. Get medical help right away. Call your local emergency services (911 in the [place] Do not drive yourself to the hospital. Summary Chest pain can be caused by many different conditions. The cause may be serious and need treatment right away. If you have chest pain, see your doctor right away. Follow your doctor's instructions for taking medicines and making lifestyle changes. Keep all follow-up visits as  told by your doctor. This includes visits for any further testing if your chest pain does not go away. Be sure to know the signs that show that your condition has become worse. Get help right away if you have these symptoms. This information is not intended to replace advice given to you by your health care provider. Make sure you discuss any questions you have  with your health care provider. Document Revised: 10/19/2020 Document Reviewed: 10/19/2020 Elsevier Patient Education  [place].

## 2022-10-09 ENCOUNTER — Encounter: Payer: Self-pay | Admitting: Nurse Practitioner

## 2022-10-24 ENCOUNTER — Telehealth: Payer: BC Managed Care – PPO | Admitting: Nurse Practitioner

## 2022-10-25 ENCOUNTER — Telehealth (INDEPENDENT_AMBULATORY_CARE_PROVIDER_SITE_OTHER): Payer: BC Managed Care – PPO | Admitting: Nurse Practitioner

## 2022-10-25 DIAGNOSIS — M545 Low back pain, unspecified: Secondary | ICD-10-CM

## 2022-10-25 DIAGNOSIS — G8929 Other chronic pain: Secondary | ICD-10-CM | POA: Diagnosis not present

## 2022-10-25 NOTE — Progress Notes (Unsigned)
[facility] [address] [place] [address] Dept: [phone] Dept Fax: [phone]  Virtual Video Visit  I connected with Einar Crow on 10/26/22 at  9:00 AM EST by a video enabled telemedicine application and verified that I am speaking with the correct person using two identifiers.  Location patient: Home Location provider: Clinic Persons participating in the virtual visit: Patient; Vance Peper, NP; Eino Farber, CMA  I discussed the limitations of evaluation and management by telemedicine and the availability of in person appointments. The patient expressed understanding and agreed to proceed.  Chief Complaint  Patient presents with   Back Pain    Ongoing, would like MRI    SUBJECTIVE:  HPI: Linda Ross is a 47 y.o. female who presents with ongoing chronic low back pain.  She states that the pain will sometimes radiate down to her hip.  The pain tends to be worse at night when she is laying down.  She takes Aleve as needed for pain which does help.  She has been having this pain for almost a year now.  She is interested in getting an MRI for further evaluation.  Patient Active Problem List   Diagnosis Date Noted   Chronic left shoulder pain 06/26/2022   Chronic migraine without aura without status migrainosus, not intractable 01/11/2022   Chronic bilateral low back pain without sciatica 01/11/2022   Erythrocytosis 01/11/2022    Past Surgical History:  Procedure Laterality Date   WISDOM TOOTH EXTRACTION      Family History  Problem Relation Age of Onset   Hypertension Mother    Cancer Father        prostate   Hypertension Father    Heart failure Father    Diabetes Father    Kidney failure Father     Social History   Tobacco Use   Smoking status: Never   Smokeless tobacco: Never  Vaping Use   Vaping Use: Never used  Substance Use Topics   Alcohol use: No   Drug  use: No     Current Outpatient Medications:    acetaminophen (TYLENOL) 500 MG tablet, Take 500 mg by mouth every 6 (six) hours as needed., Disp: , Rfl:    Cholecalciferol (VITAMIN D3) 1.25 MG (50000 UT) CAPS, Take 50,000 Units by mouth once a week., Disp: , Rfl:    Multiple Vitamins-Minerals (PX COMPLETE SENIOR MULTIVITS) TABS, Take 1 tablet by mouth daily., Disp: , Rfl:    Omega-3 1000 MG CAPS, Take 1,000 mg by mouth daily., Disp: , Rfl:    vitamin B-12 (CYANOCOBALAMIN) 1000 MCG tablet, Take 1,000 mcg by mouth daily., Disp: , Rfl:   Allergies  Allergen Reactions   Guaifenesin & Derivatives Other (See Comments)    headache    ROS: See pertinent positives and negatives per HPI.  OBSERVATIONS/OBJECTIVE:  VITALS per patient if applicable: There were no vitals filed for this visit. There is no height or weight on file to calculate BMI.    GENERAL: Alert and oriented. Appears well and in no acute distress.  HEENT: Atraumatic. Conjunctiva clear. No obvious abnormalities on inspection of external nose and ears.  NECK: Normal movements of the head and neck.  LUNGS: On inspection, no signs of respiratory distress. Breathing rate appears normal. No obvious gross SOB, gasping or wheezing, and no conversational dyspnea.  CV: No obvious cyanosis.  MS: Moves all visible extremities without noticeable abnormality.  PSYCH/NEURO: Pleasant and cooperative. No obvious depression or  anxiety. Speech and thought processing grossly intact.  ASSESSMENT AND PLAN:  Problem List Items Addressed This Visit       Other   Chronic bilateral low back pain without sciatica - Primary    Chronic, ongoing.  She is still having low back pain that will radiate to her hip and gets worse at night when she lays down.  She can continue over-the-counter medication to help with the pain.  X-ray did show mild degenerative changes.  Will get an x-ray.  Continue to do stretches daily.  Follow-up after MRI.       Relevant Orders   MR Lumbar Spine Wo Contrast     I discussed the assessment and treatment plan with the patient. The patient was provided an opportunity to ask questions and all were answered. The patient agreed with the plan and demonstrated an understanding of the instructions.   The patient was advised to call back or seek an in-person evaluation if the symptoms worsen or if the condition fails to improve as anticipated.   Charyl Dancer, NP

## 2022-10-26 ENCOUNTER — Encounter: Payer: Self-pay | Admitting: Nurse Practitioner

## 2022-10-26 NOTE — Patient Instructions (Signed)
It was great to see you!  I have ordered a MRI of your back.   Let's follow-up after your MRI  Take care,  Vance Peper, NP

## 2022-10-26 NOTE — Assessment & Plan Note (Signed)
Chronic, ongoing.  She is still having low back pain that will radiate to her hip and gets worse at night when she lays down.  She can continue over-the-counter medication to help with the pain.  X-ray did show mild degenerative changes.  Will get an x-ray.  Continue to do stretches daily.  Follow-up after MRI.

## 2022-11-07 ENCOUNTER — Encounter: Payer: Self-pay | Admitting: Cardiovascular Disease

## 2022-11-07 NOTE — Progress Notes (Unsigned)
Cardiology Office Note:    Date:  11/09/2022   ID:  Tanasha Nuber, DOB [birth year], MRN [id number]  PCP:  Charyl Dancer, NP   [facility] Cardiologist:  Sukaina Toothaker  Click to update primary MD,subspecialty MD or APP then REFRESH:1}    Referring MD: Flossie Buffy, NP   Chief Complaint  Patient presents with   Chest Pain    History of Present Illness:  11/09/22    Linda Ross is a 47 y.o. female with a hx of atypical chest pain  Josph Macho ( [place] )  Intermittant CP for the past year  Frequently occurs while lying down - which was also assoc with shortness of breath   No exertional CP  Had a normal stress test in Dec. 2021  Exercises twice a week - rides her bike 1.5 miles  No CP with house work  Pain is while sitting , and also wakes her up while sleeping   Woke up with cp and shortness of breath Wakes up with headache on occasion  Feels like she is not rested .   Is a first grade teacher multiple times [employer]    Fam hx  Father - CHF ,  Mother - Afib, pacer , 2 coronary stents     Past Medical History:  Diagnosis Date   Chronic back pain    PCL injury    Pre-diabetes    Prediabetes     Past Surgical History:  Procedure Laterality Date   WISDOM TOOTH EXTRACTION      Current Medications: Current Meds  Medication Sig   acetaminophen (TYLENOL) 500 MG tablet Take 500 mg by mouth every 6 (six) hours as needed.   Cholecalciferol (VITAMIN D3) 1.25 MG (50000 UT) CAPS Take 50,000 Units by mouth once a week.   Multiple Vitamins-Minerals (PX COMPLETE SENIOR MULTIVITS) TABS Take 1 tablet by mouth daily.   Omega-3 1000 MG CAPS Take 1,000 mg by mouth daily.   vitamin B-12 (CYANOCOBALAMIN) 1000 MCG tablet Take 1,000 mcg by mouth daily.     Allergies:   Guaifenesin & derivatives   Social History   Socioeconomic History   Marital status: Married    Spouse name: Not on file   Number of children:  Not on file   Years of education: Not on file   Highest education level: Not on file  Occupational History   Not on file  Tobacco Use   Smoking status: Never   Smokeless tobacco: Never  Vaping Use   Vaping Use: Never used  Substance and Sexual Activity   Alcohol use: No   Drug use: No   Sexual activity: Yes    Birth control/protection: None  Other Topics Concern   Not on file  Social History Narrative   Not on file   Social Determinants of Health   Financial Resource Strain: Not on file  Food Insecurity: Not on file  Transportation Needs: Not on file  Physical Activity: Not on file  Stress: Not on file  Social Connections: Not on file     Family History: The patient's family history includes Cancer in her father; Diabetes in her father; Heart failure in her father; Hypertension in her father and mother; Kidney failure in her father.  ROS:   Please see the history of present illness.     All other systems reviewed and are negative.  EKGs/Labs/Other Studies Reviewed:    The following studies were reviewed today:    EKG:  Nov 08, 2022 - NSR at 83.   Possible LAE   Recent Labs: 01/31/2022: ALT 11; BUN 11; Creatinine, Ser 0.90; Potassium 4.2; Sodium 136 08/23/2022: Hemoglobin 16.0; Platelet Count 458  Recent Lipid Panel    Component Value Date/Time   CHOL 123 01/31/2022 1549   TRIG 37.0 01/31/2022 1549   HDL 44.10 01/31/2022 1549   CHOLHDL 3 01/31/2022 1549   VLDL 7.4 01/31/2022 1549   LDLCALC 72 01/31/2022 1549     Risk Assessment/Calculations:           STOP-Bang Score:  2       Physical Exam:    VS:  BP 114/82   Pulse 83   Ht 5\' 8"  (1.727 m)   Wt 193 lb 12.8 oz (87.9 kg)   SpO2 97%   BMI 29.47 kg/m     Wt Readings from Last 3 Encounters:  11/08/22 193 lb 12.8 oz (87.9 kg)  09/27/22 196 lb (88.9 kg)  08/23/22 193 lb 4.8 oz (87.7 kg)     GEN:  Well nourished, well developed in no acute distress HEENT: Normal NECK: No JVD; No carotid  bruits LYMPHATICS: No lymphadenopathy CARDIAC: RRR, no murmurs, rubs, gallops RESPIRATORY:  Clear to auscultation without rales, wheezing or rhonchi  ABDOMEN: Soft, non-tender, non-distended MUSCULOSKELETAL:  No edema; No deformity  SKIN: Warm and dry NEUROLOGIC:  Alert and oriented x 3 PSYCHIATRIC:  Normal affect   ASSESSMENT:    1. Atypical chest pain    PLAN:    In order of problems listed above:  Atypical CP :  not exertional.   Tyically at rest .  Has woken her up at night . Could be OSA.  Ive asked her to work on getting more exercise She cycles on occasion but just 1-2 miles.  I've asked her to increase her cycling to at least 10-12 miles  Or she should walk 3-4 miles   I'll see her back in 3 months to see how she is doing with the exercise .            Medication Adjustments/Labs and Tests Ordered: Current medicines are reviewed at length with the patient today.  Concerns regarding medicines are outlined above.  Orders Placed This Encounter  Procedures   EKG 12-Lead   Itamar Sleep Study   No orders of the defined types were placed in this encounter.   Patient Instructions  Medication Instructions:  Your physician recommends that you continue on your current medications as directed. Please refer to the Current Medication list given to you today. *If you need a refill on your cardiac medications before your next appointment, please call your pharmacy*   Testing/Procedures: Itamar Sleep Study Your physician has recommended that you have a sleep study. This test records several body functions during sleep, including: brain activity, eye movement, oxygen and carbon dioxide blood levels, heart rate and rhythm, breathing rate and rhythm, the flow of air through your mouth and nose, snoring, body muscle movements, and chest and belly movement.    Follow-Up: At [facility], you and your health needs are our priority.  As part of our continuing mission  to provide you with exceptional heart care, we have created designated Provider Care Teams.  These Care Teams include your primary Cardiologist (physician) and Advanced Practice Providers (APPs -  Physician Assistants and Nurse Practitioners) who all work together to provide you with the care you need, when you need it.  We recommend signing up for  the patient portal called "MyChart".  Sign up information is provided on this After Visit Summary.  MyChart is used to connect with patients for Virtual Visits (Telemedicine).  Patients are able to view lab/test results, encounter notes, upcoming appointments, etc.  Non-urgent messages can be sent to your provider as well.   To learn more about what you can do with MyChart, go to [email or web address].    Your next appointment:   3 month(s)  Provider:   Mertie Moores, MD    Signed, Mertie Moores, MD  11/09/2022 8:18 AM    [place]

## 2022-11-08 ENCOUNTER — Ambulatory Visit: Payer: BC Managed Care – PPO | Attending: Cardiovascular Disease | Admitting: Cardiovascular Disease

## 2022-11-08 ENCOUNTER — Encounter: Payer: Self-pay | Admitting: Cardiovascular Disease

## 2022-11-08 VITALS — BP 114/82 | HR 83 | Ht 68.0 in | Wt 193.8 lb

## 2022-11-08 DIAGNOSIS — R0789 Other chest pain: Secondary | ICD-10-CM | POA: Diagnosis not present

## 2022-11-08 NOTE — Patient Instructions (Addendum)
Medication Instructions:  Your physician recommends that you continue on your current medications as directed. Please refer to the Current Medication list given to you today. *If you need a refill on your cardiac medications before your next appointment, please call your pharmacy*   Testing/Procedures: Itamar Sleep Study Your physician has recommended that you have a sleep study. This test records several body functions during sleep, including: brain activity, eye movement, oxygen and carbon dioxide blood levels, heart rate and rhythm, breathing rate and rhythm, the flow of air through your mouth and nose, snoring, body muscle movements, and chest and belly movement.    Follow-Up: At [facility], you and your health needs are our priority.  As part of our continuing mission to provide you with exceptional heart care, we have created designated Provider Care Teams.  These Care Teams include your primary Cardiologist (physician) and Advanced Practice Providers (APPs -  Physician Assistants and Nurse Practitioners) who all work together to provide you with the care you need, when you need it.  We recommend signing up for the patient portal called "MyChart".  Sign up information is provided on this After Visit Summary.  MyChart is used to connect with patients for Virtual Visits (Telemedicine).  Patients are able to view lab/test results, encounter notes, upcoming appointments, etc.  Non-urgent messages can be sent to your provider as well.   To learn more about what you can do with MyChart, go to [email or web address].    Your next appointment:   3 month(s)  Provider:   Mertie Moores, MD

## 2022-11-14 ENCOUNTER — Telehealth: Payer: Self-pay | Admitting: *Deleted

## 2022-11-14 NOTE — Telephone Encounter (Signed)
Prior Authorization for [facility] sent to [facility] via web portal. Tracking Number . Non-Authorized-DENIED:Based on the information we have, this test is not medically necessary.

## 2022-11-20 ENCOUNTER — Ambulatory Visit
Admission: RE | Admit: 2022-11-20 | Discharge: 2022-11-20 | Disposition: A | Discharge status: Home / Self Care | Payer: BC Managed Care – PPO | Source: Ambulatory Visit | Attending: Nurse Practitioner | Admitting: Nurse Practitioner

## 2022-11-20 DIAGNOSIS — M545 Low back pain, unspecified: Secondary | ICD-10-CM | POA: Diagnosis not present

## 2022-12-19 ENCOUNTER — Telehealth: Payer: Self-pay | Admitting: Oncology

## 2022-12-19 NOTE — Telephone Encounter (Signed)
Patient called to reschedule appointment till june-july when school is out. Patient is aware of time and date change.

## 2022-12-20 ENCOUNTER — Inpatient Hospital Stay: Payer: BC Managed Care – PPO | Admitting: Oncology

## 2022-12-20 ENCOUNTER — Inpatient Hospital Stay: Payer: BC Managed Care – PPO

## 2022-12-24 ENCOUNTER — Telehealth: Payer: Self-pay | Admitting: Oncology

## 2022-12-24 NOTE — Telephone Encounter (Signed)
Called patient regarding upcoming June appointments, voicemail is full. Calendar will be mailed.

## 2023-01-10 ENCOUNTER — Encounter: Payer: Self-pay | Admitting: Nurse Practitioner

## 2023-01-14 ENCOUNTER — Telehealth: Payer: Self-pay | Admitting: Nurse Practitioner

## 2023-01-14 NOTE — Telephone Encounter (Signed)
Patient dropped off document  health certificate , to be filled out by provider. Patient requested to send it via Call Patient to pick up within 5-days. Document is located in providers tray at front office.Please advise at Mobile There is no such number on file (mobile).

## 2023-01-15 NOTE — Telephone Encounter (Signed)
LVM that an appointment is needed to fill out form.

## 2023-01-17 ENCOUNTER — Encounter: Payer: Self-pay | Admitting: Nurse Practitioner

## 2023-01-17 ENCOUNTER — Ambulatory Visit (INDEPENDENT_AMBULATORY_CARE_PROVIDER_SITE_OTHER): Payer: BC Managed Care – PPO | Admitting: Nurse Practitioner

## 2023-01-17 VITALS — BP 118/80 | HR 83 | Temp 97.0°F | Ht 68.0 in | Wt 193.0 lb

## 2023-01-17 DIAGNOSIS — Z111 Encounter for screening for respiratory tuberculosis: Secondary | ICD-10-CM

## 2023-01-17 DIAGNOSIS — G43709 Chronic migraine without aura, not intractable, without status migrainosus: Secondary | ICD-10-CM

## 2023-01-17 DIAGNOSIS — Z23 Encounter for immunization: Secondary | ICD-10-CM | POA: Diagnosis not present

## 2023-01-17 DIAGNOSIS — Z1159 Encounter for screening for other viral diseases: Secondary | ICD-10-CM | POA: Diagnosis not present

## 2023-01-17 DIAGNOSIS — Z114 Encounter for screening for human immunodeficiency virus [HIV]: Secondary | ICD-10-CM

## 2023-01-17 DIAGNOSIS — D751 Secondary polycythemia: Secondary | ICD-10-CM | POA: Diagnosis not present

## 2023-01-17 DIAGNOSIS — M545 Low back pain, unspecified: Secondary | ICD-10-CM | POA: Diagnosis not present

## 2023-01-17 DIAGNOSIS — Z0184 Encounter for antibody response examination: Secondary | ICD-10-CM | POA: Diagnosis not present

## 2023-01-17 DIAGNOSIS — R5383 Other fatigue: Secondary | ICD-10-CM | POA: Diagnosis not present

## 2023-01-17 DIAGNOSIS — G8929 Other chronic pain: Secondary | ICD-10-CM

## 2023-01-17 DIAGNOSIS — Z0000 Encounter for general adult medical examination without abnormal findings: Secondary | ICD-10-CM | POA: Diagnosis not present

## 2023-01-17 NOTE — Progress Notes (Signed)
BP 118/80 (BP Location: Left Arm)   Pulse 83   Temp (!) 97 F (36.1 C)   Ht 5\' 8"  (1.727 m)   Wt 193 lb (87.5 kg)   LMP 12/30/2022 (Exact Date)   SpO2 98%   BMI 29.35 kg/m    Subjective:    Patient ID: Linda Ross, female    DOB: 18-Aug-1976, 47 y.o.   MRN: [id number]  CC: Chief Complaint  Patient presents with   Annual Exam    With form completion    HPI: Linda Ross is a 47 y.o. female presenting on 01/17/2023 for comprehensive medical examination. Current medical complaints include:none  She currently lives with: husband Menopausal Symptoms: no  Depression and Anxiety Screen done today and results listed below:     09/27/2022   11:09 AM 01/10/2022    4:52 PM  Depression screen PHQ 2/9  Decreased Interest 0 0  Down, Depressed, Hopeless 0 0  PHQ - 2 Score 0 0  Altered sleeping  0  Tired, decreased energy  2  Change in appetite  0  Feeling bad or failure about yourself   0  Trouble concentrating  0  Moving slowly or fidgety/restless  0  Suicidal thoughts  0  PHQ-9 Score  2      01/10/2022    4:52 PM  GAD 7 : Generalized Anxiety Score  Nervous, Anxious, on Edge 0  Control/stop worrying 0  Worry too much - different things 0  Trouble relaxing 0  Restless 0  Easily annoyed or irritable 0  Afraid - awful might happen 0  Total GAD 7 Score 0  Anxiety Difficulty Not difficult at all    The patient does not have a history of falls. I did not complete a risk assessment for falls. A plan of care for falls was not documented.   Past Medical History:  Past Medical History:  Diagnosis Date   Chronic back pain    PCL injury    Pre-diabetes    Prediabetes     Surgical History:  Past Surgical History:  Procedure Laterality Date   WISDOM TOOTH EXTRACTION      Medications:  Current Outpatient Medications on File Prior to Visit  Medication Sig   acetaminophen (TYLENOL) 500 MG tablet Take 500 mg by mouth every 6 (six) hours as needed.    Cholecalciferol (VITAMIN D3) 1.25 MG (50000 UT) CAPS Take 50,000 Units by mouth once a week.   Multiple Vitamins-Minerals (PX COMPLETE SENIOR MULTIVITS) TABS Take 1 tablet by mouth daily.   Omega-3 1000 MG CAPS Take 1,000 mg by mouth daily.   vitamin B-12 (CYANOCOBALAMIN) 1000 MCG tablet Take 1,000 mcg by mouth daily.   No current facility-administered medications on file prior to visit.    Allergies:  Allergies  Allergen Reactions   Guaifenesin & Derivatives Other (See Comments)    headache    Social History:  Social History   Socioeconomic History   Marital status: Married    Spouse name: Not on file   Number of children: Not on file   Years of education: Not on file   Highest education level: Not on file  Occupational History   Not on file  Tobacco Use   Smoking status: Never   Smokeless tobacco: Never  Vaping Use   Vaping Use: Never used  Substance and Sexual Activity   Alcohol use: No   Drug use: No   Sexual activity: Yes    Birth control/protection: None  Other Topics Concern   Not on file  Social History Narrative   Not on file   Social Determinants of Health   Financial Resource Strain: Not on file  Food Insecurity: Not on file  Transportation Needs: Not on file  Physical Activity: Not on file  Stress: Not on file  Social Connections: Not on file  Intimate Partner Violence: Not on file   Social History   Tobacco Use  Smoking Status Never  Smokeless Tobacco Never   Social History   Substance and Sexual Activity  Alcohol Use No    Family History:  Family History  Problem Relation Age of Onset   Hypertension Mother    Cancer Father        prostate   Hypertension Father    Heart failure Father    Diabetes Father    Kidney failure Father     Past medical history, surgical history, medications, allergies, family history and social history reviewed with patient today and changes made to appropriate areas of the chart.   Review of Systems   Constitutional: Negative.   HENT: Negative.    Eyes: Negative.   Respiratory: Negative.    Cardiovascular: Negative.   Gastrointestinal: Negative.   Genitourinary: Negative.   Musculoskeletal:  Positive for back pain (when laying down).  Skin: Negative.   Neurological: Negative.   Psychiatric/Behavioral: Negative.     All other ROS negative except what is listed above and in the HPI.      Objective:    BP 118/80 (BP Location: Left Arm)   Pulse 83   Temp (!) 97 F (36.1 C)   Ht 5\' 8"  (1.727 m)   Wt 193 lb (87.5 kg)   LMP 12/30/2022 (Exact Date)   SpO2 98%   BMI 29.35 kg/m   Wt Readings from Last 3 Encounters:  01/17/23 193 lb (87.5 kg)  11/08/22 193 lb 12.8 oz (87.9 kg)  09/27/22 196 lb (88.9 kg)    Physical Exam Vitals and nursing note reviewed.  Constitutional:      General: She is not in acute distress.    Appearance: Normal appearance.  HENT:     Head: Normocephalic and atraumatic.     Right Ear: Tympanic membrane, ear canal and external ear normal.     Left Ear: Tympanic membrane, ear canal and external ear normal.  Eyes:     Conjunctiva/sclera: Conjunctivae normal.  Cardiovascular:     Rate and Rhythm: Normal rate and regular rhythm.     Pulses: Normal pulses.     Heart sounds: Normal heart sounds.  Pulmonary:     Effort: Pulmonary effort is normal.     Breath sounds: Normal breath sounds.  Abdominal:     Palpations: Abdomen is soft.     Tenderness: There is no abdominal tenderness. There is no guarding.     Hernia: No hernia is present.     Comments: Firmness to pelvic area  Musculoskeletal:        General: Normal range of motion.     Cervical back: Normal range of motion and neck supple.     Right lower leg: No edema.     Left lower leg: No edema.  Lymphadenopathy:     Cervical: No cervical adenopathy.  Skin:    General: Skin is warm and dry.  Neurological:     General: No focal deficit present.     Mental Status: She is alert and oriented to  person, place, and time.  Cranial Nerves: No cranial nerve deficit.     Coordination: Coordination normal.     Gait: Gait normal.  Psychiatric:        Mood and Affect: Mood normal.        Behavior: Behavior normal.        Thought Content: Thought content normal.        Judgment: Judgment normal.     Results for orders placed or performed in visit on 08/23/22  CBC with Differential (Cancer Center Only)  Result Value Ref Range   WBC Count 5.0 4.0 - 10.5 K/uL   RBC 5.24 (H) 3.87 - 5.11 MIL/uL   Hemoglobin 16.0 (H) 12.0 - 15.0 g/dL   HCT 16.1 (H) 09.6 - 04.5 %   MCV 89.1 80.0 - 100.0 fL   MCH 30.5 26.0 - 34.0 pg   MCHC 34.3 30.0 - 36.0 g/dL   RDW 40.9 81.1 - 91.4 %   Platelet Count 458 (H) 150 - 400 K/uL   nRBC 0.0 0.0 - 0.2 %   Neutrophils Relative % 48 %   Neutro Abs 2.4 1.7 - 7.7 K/uL   Lymphocytes Relative 41 %   Lymphs Abs 2.0 0.7 - 4.0 K/uL   Monocytes Relative 8 %   Monocytes Absolute 0.4 0.1 - 1.0 K/uL   Eosinophils Relative 3 %   Eosinophils Absolute 0.1 0.0 - 0.5 K/uL   Basophils Relative 0 %   Basophils Absolute 0.0 0.0 - 0.1 K/uL   Immature Granulocytes 0 %   Abs Immature Granulocytes 0.01 0.00 - 0.07 K/uL      Assessment & Plan:   Problem List Items Addressed This Visit       Cardiovascular and Mediastinum   Chronic migraine without aura without status migrainosus, not intractable    Chronic, stable. She has not had any headaches recently.         Other   Chronic bilateral low back pain without sciatica    Chronic, stable. She had a MRI which was negative but did show fibroids in her uterus. She is following with GYN.       Erythrocytosis    Check CBC and iron panel today. She is following with hematology.       Relevant Orders   CBC with Differential/Platelet   Iron, TIBC and Ferritin Panel   Routine general medical examination at a health care facility - Primary    Health maintenance reviewed and updated. Discussed nutrition, exercise.  Check CMP, CBC, TSH today. Follow-up 1 year.        Relevant Orders   CBC with Differential/Platelet   Comprehensive metabolic panel   TSH   Other Visit Diagnoses     Immunization due       Tdap given today   Relevant Orders   Tdap vaccine greater than or equal to 7yo IM (Completed)   Immunity status testing       Check MMR and hepatitis B vaccine status for her job   Relevant Orders   Measles/Mumps/Rubella Immunity   Hepatitis B surface antibody,qualitative   Fatigue, unspecified type       Check CMP, CBC, TSH, iron panel, vitamin D, and vitamin B12 today   Relevant Orders   CBC with Differential/Platelet   Comprehensive metabolic panel   TSH   Vitamin B12   VITAMIN D 25 Hydroxy (Vit-D Deficiency, Fractures)   Iron, TIBC and Ferritin Panel   Screening for HIV (human immunodeficiency virus)  Screen HIV today   Relevant Orders   HIV Antibody (routine testing w rflx)   Encounter for hepatitis C screening test for low risk patient       Screen hepatitis C today   Relevant Orders   Hepatitis C antibody   Screening-pulmonary TB       Screen for TB for her job   Relevant Orders   QuantiFERON-TB Gold Plus        Follow up plan: Return in about 1 year (around 01/17/2024) for CPE.   LABORATORY TESTING:  - Pap smear: up to date  IMMUNIZATIONS:   - Tdap: Tetanus vaccination status reviewed: last tetanus booster within 10 years, Td vaccination indicated and given today. - Influenza: Postponed to flu season - Pneumovax: Not applicable - Prevnar: Not applicable - HPV: Not applicable - Zostavax vaccine: Not applicable  SCREENING: -Mammogram: Up to date  - Colonoscopy: Not applicable  - Bone Density: Not applicable   PATIENT COUNSELING:   Advised to take 1 mg of folate supplement per day if capable of pregnancy.   Sexuality: Discussed sexually transmitted diseases, partner selection, use of condoms, avoidance of unintended pregnancy  and contraceptive  alternatives.   Advised to avoid cigarette smoking.  I discussed with the patient that most people either abstain from alcohol or drink within safe limits (<=14/week and <=4 drinks/occasion for males, <=7/weeks and <= 3 drinks/occasion for females) and that the risk for alcohol disorders and other health effects rises proportionally with the number of drinks per week and how often a drinker exceeds daily limits.  Discussed cessation/primary prevention of drug use and availability of treatment for abuse.   Diet: Encouraged to adjust caloric intake to maintain  or achieve ideal body weight, to reduce intake of dietary saturated fat and total fat, to limit sodium intake by avoiding high sodium foods and not adding table salt, and to maintain adequate dietary potassium and calcium preferably from fresh fruits, vegetables, and low-fat dairy products.    stressed the importance of regular exercise  Injury prevention: Discussed safety belts, safety helmets, smoke detector, smoking near bedding or upholstery.   Dental health: Discussed importance of regular tooth brushing, flossing, and dental visits.    NEXT PREVENTATIVE PHYSICAL DUE IN 1 YEAR. Return in about 1 year (around 01/17/2024) for CPE.

## 2023-01-17 NOTE — Assessment & Plan Note (Signed)
Chronic, stable. She had a MRI which was negative but did show fibroids in her uterus. She is following with GYN.

## 2023-01-17 NOTE — Assessment & Plan Note (Signed)
Health maintenance reviewed and updated. Discussed nutrition, exercise. Check CMP, CBC, TSH today. Follow-up 1 year.   

## 2023-01-17 NOTE — Patient Instructions (Signed)
It was great to see you!  We are checking your labs today and will let you know the results via mychart/phone.   Let's follow-up in 1 year, sooner if you have concerns.  If a referral was placed today, you will be contacted for an appointment. Please note that routine referrals can sometimes take up to 3-4 weeks to process. Please call our office if you haven't heard anything after this time frame.  Take care,  Titiana Severa, NP  

## 2023-01-17 NOTE — Assessment & Plan Note (Signed)
Chronic, stable. She has not had any headaches recently.

## 2023-01-17 NOTE — Assessment & Plan Note (Signed)
Check CBC and iron panel today. She is following with hematology.

## 2023-01-18 LAB — VITAMIN B12: Vitamin B-12: 571 pg/mL (ref 200–1100)

## 2023-01-18 LAB — IRON,TIBC AND FERRITIN PANEL
Iron: 115 ug/dL (ref 40–190)
TIBC: 338 mcg/dL (calc) (ref 250–450)

## 2023-01-19 LAB — COMPREHENSIVE METABOLIC PANEL
AG Ratio: 1.5 (calc) (ref 1.0–2.5)
ALT: 12 U/L (ref 6–29)
AST: 15 U/L (ref 10–30)
Albumin: 4.6 g/dL (ref 3.6–5.1)
Alkaline phosphatase (APISO): 34 U/L (ref 31–125)
BUN: 13 mg/dL (ref 7–25)
CO2: 25 mmol/L (ref 20–32)
Calcium: 9.9 mg/dL (ref 8.6–10.2)
Chloride: 104 mmol/L (ref 98–110)
Creat: 0.84 mg/dL (ref 0.50–0.99)
Globulin: 3.1 g/dL (calc) (ref 1.9–3.7)
Glucose, Bld: 84 mg/dL (ref 65–99)
Potassium: 4.8 mmol/L (ref 3.5–5.3)
Sodium: 140 mmol/L (ref 135–146)
Total Bilirubin: 0.8 mg/dL (ref 0.2–1.2)
Total Protein: 7.7 g/dL (ref 6.1–8.1)

## 2023-01-19 LAB — CBC WITH DIFFERENTIAL/PLATELET
Absolute Monocytes: 371 cells/uL (ref 200–950)
Basophils Absolute: 23 cells/uL (ref 0–200)
Basophils Relative: 0.4 %
Eosinophils Absolute: 148 cells/uL (ref 15–500)
Eosinophils Relative: 2.6 %
HCT: 49.4 % — ABNORMAL HIGH (ref 35.0–45.0)
Hemoglobin: 16.9 g/dL — ABNORMAL HIGH (ref 11.7–15.5)
Lymphs Abs: 2400 cells/uL (ref 850–3900)
MCH: 30.3 pg (ref 27.0–33.0)
MCHC: 34.2 g/dL (ref 32.0–36.0)
MCV: 88.5 fL (ref 80.0–100.0)
MPV: 8.9 fL (ref 7.5–12.5)
Monocytes Relative: 6.5 %
Neutro Abs: 2759 cells/uL (ref 1500–7800)
Neutrophils Relative %: 48.4 %
Platelets: 565 10*3/uL — ABNORMAL HIGH (ref 140–400)
RBC: 5.58 10*6/uL — ABNORMAL HIGH (ref 3.80–5.10)
RDW: 13.1 % (ref 11.0–15.0)
Total Lymphocyte: 42.1 %
WBC: 5.7 10*3/uL (ref 3.8–10.8)

## 2023-01-19 LAB — MEASLES/MUMPS/RUBELLA IMMUNITY
Mumps IgG: 64.7 AU/mL
Rubella: 2.52 Index
Rubeola IgG: 179 AU/mL

## 2023-01-19 LAB — QUANTIFERON-TB GOLD PLUS
Mitogen-NIL: >10 IU/mL
NIL: 0.03 IU/mL
QuantiFERON-TB Gold Plus: NEGATIVE
TB1-NIL: 0.01 IU/mL
TB2-NIL: 0 IU/mL

## 2023-01-19 LAB — HIV ANTIBODY (ROUTINE TESTING W REFLEX): HIV 1&2 Ab, 4th Generation: NONREACTIVE

## 2023-01-19 LAB — VITAMIN D 25 HYDROXY (VIT D DEFICIENCY, FRACTURES): Vit D, 25-Hydroxy: 38 ng/mL (ref 30–100)

## 2023-01-19 LAB — HEPATITIS C ANTIBODY: Hepatitis C Ab: NONREACTIVE

## 2023-01-19 LAB — IRON,TIBC AND FERRITIN PANEL
%SAT: 34 % (calc) (ref 16–45)
Ferritin: 29 ng/mL (ref 16–232)

## 2023-01-19 LAB — HEPATITIS B SURFACE ANTIBODY,QUALITATIVE: Hep B S Ab: REACTIVE — AB

## 2023-01-19 LAB — TSH: TSH: 1.62 mIU/L

## 2023-01-23 NOTE — Progress Notes (Signed)
[facility] Cancer Follow up Visit:  Patient Care Team: Gerre Scull, NP as PCP - General (Internal Medicine) Silverio Lay, MD as Consulting Physician (Obstetrics and Gynecology)  CHIEF COMPLAINTS/PURPOSE OF CONSULTATION:  Oncology History  Polycythemia vera (HCC)  01/24/2023 Initial Diagnosis   Polycythemia vera (HCC)     HISTORY OF PRESENTING ILLNESS: Linda Ross 47 y.o. 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   No fevers, chills.  No longer having continuous HA's as she was having in 2023.  Has fatigue.  No aquagenic pruritus.  Occasional leg and back pain.  Still has uterine fibroids for which she is seeing gynecology for consideration of Lupron followed by myomectomy.  No plans for more children.    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  Tobacco none.  EtOH rare.     Review of Systems - Oncology  MEDICAL HISTORY: Past Medical History:  Diagnosis Date   Chronic back pain    PCL injury    Pre-diabetes    Prediabetes     SURGICAL HISTORY: Past Surgical History:  Procedure Laterality Date   WISDOM TOOTH EXTRACTION      SOCIAL HISTORY: Social History   Socioeconomic History   Marital status: Married    Spouse name: Not on file   Number of children: Not on file   Years of education: Not on file   Highest education level: Not on file  Occupational History   Not on file  Tobacco Use   Smoking status: Never   Smokeless tobacco: Never  Vaping Use   Vaping Use: Never used  Substance and Sexual Activity   Alcohol use: No   Drug use: No   Sexual activity: Yes    Birth  control/protection: None  Other Topics Concern   Not on file  Social History Narrative   Not on file   Social Determinants of Health   Financial Resource Strain: Not on file  Food Insecurity: Not on file  Transportation Needs: Not on file  Physical Activity: Not on file  Stress: Not on file  Social Connections: Not on file  Intimate Partner Violence: Not on file    FAMILY HISTORY Family History  Problem Relation Age of Onset   Hypertension Mother    Cancer Father        prostate   Hypertension Father    Heart failure Father    Diabetes Father    Kidney failure Father     ALLERGIES:  is allergic to guaifenesin & derivatives.  MEDICATIONS:  Current Outpatient Medications  Medication Sig Dispense Refill   acetaminophen (TYLENOL) 500 MG tablet Take 500 mg by mouth every 6 (six) hours as needed.     Cholecalciferol (VITAMIN D3) 1.25 MG (50000 UT) CAPS Take 50,000 Units by mouth once a week.     Multiple Vitamins-Minerals (PX COMPLETE SENIOR MULTIVITS) TABS Take 1 tablet by mouth daily.     Omega-3 1000 MG CAPS Take 1,000 mg by mouth daily.     vitamin B-12 (CYANOCOBALAMIN) 1000 MCG tablet Take 1,000 mcg by mouth daily.     No  current facility-administered medications for this visit.    PHYSICAL EXAMINATION:  ECOG PERFORMANCE STATUS: 0 - Asymptomatic   Vitals:   01/24/23 0918  BP: (!) 115/56  Pulse: 93  Resp: 16  Temp: 98.3 F (36.8 C)  SpO2: 98%    Filed Weights   01/24/23 0918  Weight: 192 lb 9.6 oz (87.4 kg)     Physical Exam Vitals and nursing note reviewed.  Constitutional:      General: She is not in acute distress.    Appearance: Normal appearance. She is normal weight. She is not ill-appearing, toxic-appearing or diaphoretic.     Comments: Here alone.    HENT:     Head: Normocephalic and atraumatic.     Right Ear: External ear normal.     Left Ear: External ear normal.     Nose: Nose normal. No congestion or rhinorrhea.  Eyes:     General:  No scleral icterus.    Extraocular Movements: Extraocular movements intact.     Conjunctiva/sclera: Conjunctivae normal.     Pupils: Pupils are equal, round, and reactive to light.  Cardiovascular:     Rate and Rhythm: Normal rate and regular rhythm.     Heart sounds: No murmur heard.    No friction rub. No gallop.  Pulmonary:     Effort: Pulmonary effort is normal. No respiratory distress.     Breath sounds: Normal breath sounds.  Abdominal:     General: Bowel sounds are normal. There is no distension.     Palpations: Abdomen is soft. There is no mass.     Tenderness: There is no abdominal tenderness. There is no guarding or rebound.  Musculoskeletal:        General: No swelling, tenderness or deformity.     Cervical back: Normal range of motion and neck supple. No rigidity or tenderness.     Right lower leg: No edema.     Left lower leg: No edema.  Lymphadenopathy:     Head:     Right side of head: No submental, submandibular, tonsillar, preauricular, posterior auricular or occipital adenopathy.     Left side of head: No submental, submandibular, tonsillar, preauricular, posterior auricular or occipital adenopathy.     Cervical: No cervical adenopathy.     Right cervical: No superficial, deep or posterior cervical adenopathy.    Left cervical: No superficial, deep or posterior cervical adenopathy.     Upper Body:     Right upper body: No supraclavicular, axillary, pectoral or epitrochlear adenopathy.     Left upper body: No supraclavicular, axillary, pectoral or epitrochlear adenopathy.  Skin:    General: Skin is warm.     Coloration: Skin is not jaundiced.     Findings: No lesion.  Neurological:     General: No focal deficit present.     Mental Status: She is alert and oriented to person, place, and time.     Cranial Nerves: No cranial nerve deficit.  Psychiatric:        Mood and Affect: Mood normal.        Behavior: Behavior normal.        Thought Content: Thought content  normal.        Judgment: Judgment normal.      LABORATORY DATA: I have personally reviewed the data as listed:  Office Visit on 01/17/2023  Component Date Value Ref Range Status   Rubeola IgG 01/17/2023 179.00  AU/mL Final   Comment: AU/mL  Interpretation -----            -------------- <13.50           Not consistent with immunity 13.50-16.49      Equivocal >16.49           Consistent with immunity . The presence of measles IgG suggests immunization or past or current infection with measles virus. . For additional information, please refer to http://education.[facility].com/faq/FAQ162 (This link is being provided for informational/ educational purposes only.) .    Mumps IgG 01/17/2023 64.70  AU/mL Final   Comment:  AU/mL           Interpretation -------         ---------------- <9.00             Not consistent with immunity 9.00-10.99        Equivocal >10.99            Consistent with immunity . The presence of mumps IgG antibody suggests immunization or past or current infection with mumps virus. .    Rubella 01/17/2023 2.52  Index Final   Comment:     Index            Interpretation     -----            --------------       <0.90            Not consistent with immunity     0.90-0.99        Equivocal     > or = 1.00      Consistent with immunity  . The presence of rubella IgG antibody suggests  immunization or past or current infection with rubella virus.    Hep B S Ab 01/17/2023 REACTIVE (A)  NON-REACTIVE Final   WBC 01/17/2023 5.7  3.8 - 10.8 Thousand/uL Final   RBC 01/17/2023 5.58 (H)  3.80 - 5.10 Million/uL Final   Hemoglobin 01/17/2023 16.9 (H)  11.7 - 15.5 g/dL Final   HCT 21/30/8657 49.4 (H)  35.0 - 45.0 % Final   MCV 01/17/2023 88.5  80.0 - 100.0 fL Final   MCH 01/17/2023 30.3  27.0 - 33.0 pg Final   MCHC 01/17/2023 34.2  32.0 - 36.0 g/dL Final   RDW 84/69/6295 13.1  11.0 - 15.0 % Final   Platelets 01/17/2023 565 (H)  140 - 400  Thousand/uL Final   MPV 01/17/2023 8.9  7.5 - 12.5 fL Final   Neutro Abs 01/17/2023 2,759  1,500 - 7,800 cells/uL Final   Lymphs Abs 01/17/2023 2,400  850 - 3,900 cells/uL Final   Absolute Monocytes 01/17/2023 371  200 - 950 cells/uL Final   Eosinophils Absolute 01/17/2023 148  15 - 500 cells/uL Final   Basophils Absolute 01/17/2023 23  0 - 200 cells/uL Final   Neutrophils Relative % 01/17/2023 48.4  % Final   Total Lymphocyte 01/17/2023 42.1  % Final   Monocytes Relative 01/17/2023 6.5  % Final   Eosinophils Relative 01/17/2023 2.6  % Final   Basophils Relative 01/17/2023 0.4  % Final   Glucose, Bld 01/17/2023 84  65 - 99 mg/dL Final   Comment: .            Fasting reference interval .    BUN 01/17/2023 13  7 - 25 mg/dL Final   Creat 28/41/3244 0.84  0.50 - 0.99 mg/dL Final   BUN/Creatinine Ratio 01/17/2023 SEE NOTE:  6 - 22 (calc) Final   Comment:  Not Reported: BUN and Creatinine are within    reference range. .    Sodium 01/17/2023 140  135 - 146 mmol/L Final   Potassium 01/17/2023 4.8  3.5 - 5.3 mmol/L Final   Chloride 01/17/2023 104  98 - 110 mmol/L Final   CO2 01/17/2023 25  20 - 32 mmol/L Final   Calcium 01/17/2023 9.9  8.6 - 10.2 mg/dL Final   Total Protein 86/57/8469 7.7  6.1 - 8.1 g/dL Final   Albumin 62/95/2841 4.6  3.6 - 5.1 g/dL Final   Globulin 32/44/0102 3.1  1.9 - 3.7 g/dL (calc) Final   AG Ratio 01/17/2023 1.5  1.0 - 2.5 (calc) Final   Total Bilirubin 01/17/2023 0.8  0.2 - 1.2 mg/dL Final   Alkaline phosphatase (APISO) 01/17/2023 34  31 - 125 U/L Final   AST 01/17/2023 15  10 - 30 U/L Final   ALT 01/17/2023 12  6 - 29 U/L Final   TSH 01/17/2023 1.62  mIU/L Final   Comment:           Reference Range .           > or = 20 Years  0.40-4.50 .                Pregnancy Ranges           First trimester    0.26-2.66           Second trimester   0.55-2.73           Third trimester    0.43-2.91    Vitamin B-12 01/17/2023 571  200 - 1,100 pg/mL Final   Vit D,  25-Hydroxy 01/17/2023 38  30 - 100 ng/mL Final   Comment: Vitamin D Status         25-OH Vitamin D: . Deficiency:                    <20 ng/mL Insufficiency:             20 - 29 ng/mL Optimal:                 > or = 30 ng/mL . For 25-OH Vitamin D testing on patients on  D2-supplementation and patients for whom quantitation  of D2 and D3 fractions is required, the [facility](TM) 25-OH VIT D, (D2,D3), LC/MS/MS is recommended: order  code 72536 (patients >66yrs). . See Note 1 . Note 1 . For additional information, please refer to  http://education.[facility].com/faq/FAQ199  (This link is being provided for informational/ educational purposes only.)    Hepatitis C Ab 01/17/2023 NON-REACTIVE  NON-REACTIVE Final   Comment: . HCV antibody was non-reactive. There is no laboratory  evidence of HCV infection. . In most cases, no further action is required. However, if recent HCV exposure is suspected, a test for HCV RNA (test code 64403) is suggested. . For additional information please refer to http://education.[facility].com/faq/FAQ22v1 (This link is being provided for informational/ educational purposes only.) .    HIV 1&2 Ab, 4th Generation 01/17/2023 NON-REACTIVE  NON-REACTIVE Final   Comment: HIV-1 antigen and HIV-1/HIV-2 antibodies were not detected. There is no laboratory evidence of HIV infection. Marland Kitchen PLEASE NOTE: This information has been disclosed to you from records whose confidentiality may be protected by state law.  If your state requires such protection, then the state law prohibits you from making any further disclosure of the information without the specific written consent of the person to whom it pertains, or  as otherwise permitted by law. A general authorization for the release of medical or other information is NOT sufficient for this purpose. . For additional information please refer  to http://education.[facility].com/faq/FAQ106 (This link is being provided for informational/ educational purposes only.) . Marland Kitchen The performance of this assay has not been clinically validated in patients less than 50 years old. .    Iron 01/17/2023 115  40 - 190 mcg/dL Final   TIBC 16/05/9603 338  250 - 450 mcg/dL (calc) Final   %SAT 54/04/8118 34  16 - 45 % (calc) Final   Ferritin 01/17/2023 29  16 - 232 ng/mL Final   QuantiFERON-TB Gold Plus 01/17/2023 NEGATIVE  NEGATIVE Final   Comment: Negative test result. M. tuberculosis complex  infection unlikely.    NIL 01/17/2023 0.03  IU/mL Final   Mitogen-NIL 01/17/2023 >10.00  IU/mL Final   TB1-NIL 01/17/2023 0.01  IU/mL Final   TB2-NIL 01/17/2023 0.00  IU/mL Final   Comment: . The Nil tube value reflects the background interferon gamma immune response of the patient's blood sample. This value has been subtracted from the patient's displayed TB and Mitogen results. . Lower than expected results with the Mitogen tube prevent false-negative Quantiferon readings by detecting a patient with a potential immune suppressive condition and/or suboptimal pre-analytical specimen handling. . The TB1 Antigen tube is coated with the M. tuberculosis-specific antigens designed to elicit responses from TB antigen primed CD4+ helper T-lymphocytes. . The TB2 Antigen tube is coated with the M. tuberculosis-specific antigens designed to elicit responses from TB antigen primed CD4+ helper and CD8+ cytotoxic T-lymphocytes. . For additional information, please refer to https://education.[facility].com/faq/FAQ204 (This link is being provided for informational/ educational purposes only.) .     RADIOGRAPHIC STUDIES: I have personally reviewed the radiological images as listed and agree with the findings in the report  No results found.  ASSESSMENT/PLAN  47 y.o. 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  Medical history  includes uterine fibroids  Jak 2 MPN  January 30, 2022: WBC 5.6 hemoglobin 15.4 platelet count 584; 51 segs 40 lymphs 8 monos 1 EO 1 basophil March 01, 2022: JAK2 panel demonstrated JAK2 V6 60F mutation  Jan 17, 2023: WBC 5.7 hemoglobin 16.9 platelet count 565; 48 segs 7 lymphs 3 monos January 24 2023- Recommend that patient undergo bone marrow bx and aspirate to obtain baseline, evaluate for fibrosis and obtain material for additional molecular testing.  Explained the pathophysiology and management of MPN's with patient  Therapeutics  January 24 2023- Begin ASA 81 mg daily and therapeutic phlebotomies to decrease Hgb    Cancer Staging  No matching staging information was found for the patient.   No problem-specific Assessment & Plan notes found for this encounter.    No orders of the defined types were placed in this encounter.   57  minutes was spent in patient care.  This included time spent preparing to see the patient (e.g., review of tests), obtaining and/or reviewing separately obtained history, counseling and educating the patient/family/caregiver, ordering medications, tests, or procedures; documenting clinical information in the electronic or other health record, independently interpreting results and communicating results to the patient/family/caregiver as well as coordination of care.       All questions were answered. The patient knows to call the clinic with any problems, questions or concerns.  This note was electronically signed.    Loni Muse, MD  01/24/2023 9:54 AM

## 2023-01-24 ENCOUNTER — Inpatient Hospital Stay: Payer: BC Managed Care – PPO | Attending: Nurse Practitioner

## 2023-01-24 ENCOUNTER — Other Ambulatory Visit: Payer: Self-pay

## 2023-01-24 ENCOUNTER — Inpatient Hospital Stay (HOSPITAL_BASED_OUTPATIENT_CLINIC_OR_DEPARTMENT_OTHER): Payer: BC Managed Care – PPO | Admitting: Oncology

## 2023-01-24 ENCOUNTER — Inpatient Hospital Stay: Payer: BC Managed Care – PPO

## 2023-01-24 VITALS — BP 115/56 | HR 93 | Temp 98.3°F | Resp 16 | Wt 192.6 lb

## 2023-01-24 DIAGNOSIS — D45 Polycythemia vera: Secondary | ICD-10-CM

## 2023-01-24 DIAGNOSIS — D259 Leiomyoma of uterus, unspecified: Secondary | ICD-10-CM | POA: Diagnosis not present

## 2023-01-24 DIAGNOSIS — D471 Chronic myeloproliferative disease: Secondary | ICD-10-CM | POA: Insufficient documentation

## 2023-01-24 LAB — CBC WITH DIFFERENTIAL/PLATELET
Abs Immature Granulocytes: 0.01 10*3/uL (ref 0.00–0.07)
Basophils Absolute: 0 10*3/uL (ref 0.0–0.1)
Basophils Relative: 1 %
Eosinophils Absolute: 0.1 10*3/uL (ref 0.0–0.5)
Eosinophils Relative: 3 %
HCT: 47.8 % — ABNORMAL HIGH (ref 36.0–46.0)
Hemoglobin: 16.6 g/dL — ABNORMAL HIGH (ref 12.0–15.0)
Immature Granulocytes: 0 %
Lymphocytes Relative: 38 %
Lymphs Abs: 2 10*3/uL (ref 0.7–4.0)
MCH: 30.7 pg (ref 26.0–34.0)
MCHC: 34.7 g/dL (ref 30.0–36.0)
MCV: 88.5 fL (ref 80.0–100.0)
Monocytes Absolute: 0.4 10*3/uL (ref 0.1–1.0)
Monocytes Relative: 8 %
Neutro Abs: 2.6 10*3/uL (ref 1.7–7.7)
Neutrophils Relative %: 50 %
Platelets: 562 10*3/uL — ABNORMAL HIGH (ref 150–400)
RBC: 5.4 MIL/uL — ABNORMAL HIGH (ref 3.87–5.11)
RDW: 13.4 % (ref 11.5–15.5)
WBC: 5.1 10*3/uL (ref 4.0–10.5)
nRBC: 0 % (ref 0.0–0.2)

## 2023-01-24 LAB — COMPREHENSIVE METABOLIC PANEL
ALT: 8 U/L (ref 0–44)
AST: 15 U/L (ref 15–41)
Albumin: 4.3 g/dL (ref 3.5–5.0)
Alkaline Phosphatase: 33 U/L — ABNORMAL LOW (ref 38–126)
Anion gap: 4 — ABNORMAL LOW (ref 5–15)
BUN: 18 mg/dL (ref 6–20)
CO2: 28 mmol/L (ref 22–32)
Calcium: 9.4 mg/dL (ref 8.9–10.3)
Chloride: 106 mmol/L (ref 98–111)
Creatinine, Ser: 0.96 mg/dL (ref 0.44–1.00)
GFR, Estimated: >60 mL/min (ref >60)
Glucose, Bld: 90 mg/dL (ref 70–99)
Potassium: 4.4 mmol/L (ref 3.5–5.1)
Sodium: 138 mmol/L (ref 135–145)
Total Bilirubin: 0.6 mg/dL (ref 0.3–1.2)
Total Protein: 7.7 g/dL (ref 6.5–8.1)

## 2023-01-24 LAB — LACTATE DEHYDROGENASE: LDH: 216 U/L — ABNORMAL HIGH (ref 98–192)

## 2023-01-24 LAB — FERRITIN: Ferritin: 32 ng/mL (ref 11–307)

## 2023-01-24 NOTE — Patient Instructions (Signed)
Please begin aspirin 81 mg daiily

## 2023-01-27 ENCOUNTER — Other Ambulatory Visit: Payer: Self-pay

## 2023-01-27 ENCOUNTER — Telehealth: Payer: Self-pay

## 2023-01-27 ENCOUNTER — Telehealth: Payer: Self-pay | Admitting: Oncology

## 2023-01-27 NOTE — Telephone Encounter (Signed)
Left patient a vm regarding upcoming appointment  

## 2023-01-27 NOTE — Telephone Encounter (Signed)
Patient came in office to pick up paperwork. I could not find her paperwork but will call medical records. I told patient that I will call her tomorrow.

## 2023-01-28 NOTE — Telephone Encounter (Signed)
LVM that form completed and will leave at the front desk for pick up

## 2023-02-04 ENCOUNTER — Other Ambulatory Visit: Payer: Self-pay

## 2023-02-04 ENCOUNTER — Inpatient Hospital Stay: Payer: BC Managed Care – PPO

## 2023-02-04 VITALS — BP 122/68 | HR 84 | Temp 98.1°F | Resp 16

## 2023-02-04 DIAGNOSIS — D259 Leiomyoma of uterus, unspecified: Secondary | ICD-10-CM | POA: Diagnosis not present

## 2023-02-04 DIAGNOSIS — D45 Polycythemia vera: Secondary | ICD-10-CM

## 2023-02-04 NOTE — Patient Instructions (Signed)

## 2023-02-04 NOTE — Progress Notes (Signed)
Linda Ross presents today for phlebotomy per MD orders. Phlebotomy procedure started at 1412 and ended at 1418. A 16 G phlebotomy kit was used to the L AC.  521 grams removed. Patient observed for 30 minutes after procedure. At 1426 Pt c/o blurry vision. This RN positioned Pt supine in the infusion chair and provided Pt with water. VS obtained and were WDL. At 1632 Pt informed this RN that she was feeling better and that her eyes were no longer blurry. Patient tolerated procedure well. IV needle removed intact. VSS at discharge.  Pt ambulatory to lobby without complaint.

## 2023-02-17 ENCOUNTER — Ambulatory Visit: Payer: BC Managed Care – PPO | Admitting: Cardiovascular Disease

## 2023-02-27 ENCOUNTER — Other Ambulatory Visit: Payer: Self-pay | Admitting: Oncology

## 2023-02-27 DIAGNOSIS — D751 Secondary polycythemia: Secondary | ICD-10-CM

## 2023-02-27 NOTE — Progress Notes (Signed)
[facility] Cancer Follow up Visit:  Patient Care Team: Gerre Scull, NP as PCP - General (Internal Medicine) Silverio Lay, MD as Consulting Physician (Obstetrics and Gynecology)  CHIEF COMPLAINTS/PURPOSE OF CONSULTATION:  Oncology History  Polycythemia vera (HCC)  01/24/2023 Initial Diagnosis   Polycythemia vera (HCC)     HISTORY OF PRESENTING ILLNESS: Linda Ross 47 y.o. female is here because of polycythemia vera Medical history includes uterine fibroids January 30, 2022: WBC 5.6 hemoglobin 15.4 platelet count 584; 51 segs 40 lymphs 8 monos 1 EO 1 basophil March 01, 2022: JAK2 panel demonstrated JAK2 V6 50F mutation  Jan 17, 2023: WBC 5.7 hemoglobin 16.9 platelet count 565; 48 segs 7 lymphs 3 monos  January 24 2023:  No fevers, chills.  No longer having continuous HA's as she was having in 2023.  Has fatigue.  No aquagenic pruritus.  Occasional leg and back pain.  Still has uterine fibroids for which she is seeing gynecology for consideration of Lupron followed by myomectomy.  No plans for more children.    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  Tobacco none.  EtOH rare.    WBC 5.1 hemoglobin 16.6 platelet count 562; 50 segs 30 lymphs 8 monos 3 eos 1 basophil. Ferritin 32 LDH 216  February 04 2023:  Therapeutic phlebotomy  February 28 2023:  Scheduled follow up for MPN.    For therapeutic phlebotomy.  Feels well.    WBC 4.5 hemoglobin 15.5 platelet count 540; 47 segs 42 lymphs 7 monos 4 eos   Review of Systems - Oncology  MEDICAL HISTORY: Past Medical History:  Diagnosis Date   Chronic back pain    PCL injury    Pre-diabetes    Prediabetes     SURGICAL HISTORY: Past Surgical History:  Procedure Laterality Date   WISDOM TOOTH EXTRACTION      SOCIAL HISTORY: Social History   Socioeconomic History   Marital status: Married    Spouse name: Not on file   Number of children: Not on file   Years of education: Not  on file   Highest education level: Not on file  Occupational History   Not on file  Tobacco Use   Smoking status: Never   Smokeless tobacco: Never  Vaping Use   Vaping status: Never Used  Substance and Sexual Activity   Alcohol use: No   Drug use: No   Sexual activity: Yes    Birth control/protection: None  Other Topics Concern   Not on file  Social History Narrative   Not on file   Social Determinants of Health   Financial Resource Strain: Not on file  Food Insecurity: Not on file  Transportation Needs: Not on file  Physical Activity: Not on file  Stress: Not on file  Social Connections: Not on file  Intimate Partner Violence: Not on file    FAMILY HISTORY Family History  Problem Relation Age of Onset   Hypertension Mother    Cancer Father        prostate   Hypertension Father    Heart failure Father    Diabetes Father    Kidney failure Father     ALLERGIES:  is allergic to guaifenesin & derivatives.  MEDICATIONS:  Current Outpatient Medications  Medication Sig Dispense Refill   acetaminophen (TYLENOL) 500 MG tablet Take 500 mg by mouth every 6 (six) hours as needed.     Cholecalciferol (VITAMIN D3) 1.25 MG (50000 UT) CAPS Take  50,000 Units by mouth once a week.     Multiple Vitamins-Minerals (PX COMPLETE SENIOR MULTIVITS) TABS Take 1 tablet by mouth daily.     Omega-3 1000 MG CAPS Take 1,000 mg by mouth daily.     vitamin B-12 (CYANOCOBALAMIN) 1000 MCG tablet Take 1,000 mcg by mouth daily.     No current facility-administered medications for this visit.    PHYSICAL EXAMINATION:  ECOG PERFORMANCE STATUS: 0 - Asymptomatic   There were no vitals filed for this visit.   There were no vitals filed for this visit.    Physical Exam Vitals and nursing note reviewed.  Constitutional:      General: She is not in acute distress.    Appearance: Normal appearance. She is normal weight. She is not ill-appearing, toxic-appearing or diaphoretic.      Comments: Here alone.    HENT:     Head: Normocephalic and atraumatic.     Right Ear: External ear normal.     Left Ear: External ear normal.     Nose: Nose normal. No congestion or rhinorrhea.  Eyes:     General: No scleral icterus.    Extraocular Movements: Extraocular movements intact.     Conjunctiva/sclera: Conjunctivae normal.     Pupils: Pupils are equal, round, and reactive to light.  Cardiovascular:     Rate and Rhythm: Normal rate and regular rhythm.     Heart sounds: No murmur heard.    No friction rub. No gallop.  Pulmonary:     Effort: Pulmonary effort is normal. No respiratory distress.     Breath sounds: Normal breath sounds.  Abdominal:     General: Bowel sounds are normal. There is no distension.     Palpations: Abdomen is soft. There is no mass.     Tenderness: There is no abdominal tenderness. There is no guarding or rebound.  Musculoskeletal:        General: No swelling, tenderness or deformity.     Cervical back: Normal range of motion and neck supple. No rigidity or tenderness.     Right lower leg: No edema.     Left lower leg: No edema.  Lymphadenopathy:     Head:     Right side of head: No submental, submandibular, tonsillar, preauricular, posterior auricular or occipital adenopathy.     Left side of head: No submental, submandibular, tonsillar, preauricular, posterior auricular or occipital adenopathy.     Cervical: No cervical adenopathy.     Right cervical: No superficial, deep or posterior cervical adenopathy.    Left cervical: No superficial, deep or posterior cervical adenopathy.     Upper Body:     Right upper body: No supraclavicular, axillary, pectoral or epitrochlear adenopathy.     Left upper body: No supraclavicular, axillary, pectoral or epitrochlear adenopathy.  Skin:    General: Skin is warm.     Coloration: Skin is not jaundiced.     Findings: No lesion.  Neurological:     General: No focal deficit present.     Mental Status: She is  alert and oriented to person, place, and time.     Cranial Nerves: No cranial nerve deficit.  Psychiatric:        Mood and Affect: Mood normal.        Behavior: Behavior normal.        Thought Content: Thought content normal.        Judgment: Judgment normal.      LABORATORY DATA: I have  personally reviewed the data as listed:  No visits with results within 1 Month(s) from this visit.  Latest known visit with results is:  Appointment on 01/24/2023  Component Date Value Ref Range Status   LDH 01/24/2023 216 (H)  98 - 192 U/L Final   Performed at [facility], [address] [place], [place] [address]   Ferritin 01/24/2023 32  11 - 307 ng/mL Final   Performed at [facility], [address], [place], [place] [address]    RADIOGRAPHIC STUDIES: I have personally reviewed the radiological images as listed and agree with the findings in the report  No results found.  ASSESSMENT/PLAN  47 y.o. 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  Medical history includes uterine fibroids  Jak 2 MPN  January 30, 2022: WBC 5.6 hemoglobin 15.4 platelet count 584; 51 segs 40 lymphs 8 monos 1 EO 1 basophil March 01, 2022: JAK2 panel demonstrated JAK2 V6 84F mutation  Jan 17, 2023: WBC 5.7 hemoglobin 16.9 platelet count 565; 48 segs 7 lymphs 3 monos January 24 2023- Recommend that patient undergo bone marrow bx and aspirate to obtain baseline, evaluate for fibrosis and obtain material for additional molecular testing.  Explained the pathophysiology and management of MPN's with patient  February 28 2023:  Hgb 15.5 PLT 540.  To continue therapeutic phlebotomies and undergo bone marrow bx   Therapeutics  January 24 2023- Begin ASA 81 mg daily and therapeutic phlebotomies to decrease Hgb    Cancer Staging  No matching staging information was found for the patient.    No problem-specific Assessment & Plan notes found for this encounter.    No orders of the  defined types were placed in this encounter.   30  minutes was spent in patient care.  This included time spent preparing to see the patient (e.g., review of tests), obtaining and/or reviewing separately obtained history, counseling and educating the patient/family/caregiver, ordering medications, tests, or procedures; documenting clinical information in the electronic or other health record, independently interpreting results and communicating results to the patient/family/caregiver as well as coordination of care.       All questions were answered. The patient knows to call the clinic with any problems, questions or concerns.  This note was electronically signed.    Loni Muse, MD  02/27/2023 12:12 PM

## 2023-02-28 ENCOUNTER — Inpatient Hospital Stay: Payer: BC Managed Care – PPO

## 2023-02-28 ENCOUNTER — Inpatient Hospital Stay (HOSPITAL_BASED_OUTPATIENT_CLINIC_OR_DEPARTMENT_OTHER): Payer: BC Managed Care – PPO | Admitting: Oncology

## 2023-02-28 ENCOUNTER — Inpatient Hospital Stay: Payer: BC Managed Care – PPO | Attending: Oncology

## 2023-02-28 ENCOUNTER — Encounter: Payer: Self-pay | Admitting: Oncology

## 2023-02-28 VITALS — BP 116/75 | HR 73 | Temp 98.1°F | Resp 17 | Wt 193.5 lb

## 2023-02-28 VITALS — BP 112/83 | HR 70 | Resp 17

## 2023-02-28 DIAGNOSIS — D45 Polycythemia vera: Secondary | ICD-10-CM | POA: Insufficient documentation

## 2023-02-28 DIAGNOSIS — D751 Secondary polycythemia: Secondary | ICD-10-CM

## 2023-02-28 LAB — CBC WITH DIFFERENTIAL (CANCER CENTER ONLY)
Abs Immature Granulocytes: 0 10*3/uL (ref 0.00–0.07)
Basophils Absolute: 0 10*3/uL (ref 0.0–0.1)
Basophils Relative: 0 %
Eosinophils Absolute: 0.2 10*3/uL (ref 0.0–0.5)
Eosinophils Relative: 4 %
HCT: 46.2 % — ABNORMAL HIGH (ref 36.0–46.0)
Hemoglobin: 15.5 g/dL — ABNORMAL HIGH (ref 12.0–15.0)
Immature Granulocytes: 0 %
Lymphocytes Relative: 42 %
Lymphs Abs: 1.9 10*3/uL (ref 0.7–4.0)
MCH: 30.2 pg (ref 26.0–34.0)
MCHC: 33.5 g/dL (ref 30.0–36.0)
MCV: 90.1 fL (ref 80.0–100.0)
Monocytes Absolute: 0.3 10*3/uL (ref 0.1–1.0)
Monocytes Relative: 7 %
Neutro Abs: 2.1 10*3/uL (ref 1.7–7.7)
Neutrophils Relative %: 47 %
Platelet Count: 540 10*3/uL — ABNORMAL HIGH (ref 150–400)
RBC: 5.13 MIL/uL — ABNORMAL HIGH (ref 3.87–5.11)
RDW: 13.9 % (ref 11.5–15.5)
WBC Count: 4.5 10*3/uL (ref 4.0–10.5)
nRBC: 0 % (ref 0.0–0.2)

## 2023-02-28 LAB — FERRITIN: Ferritin: 12 ng/mL (ref 11–307)

## 2023-02-28 NOTE — Progress Notes (Signed)
Kasee Baptist-Brown presents today for phlebotomy per MD orders. Phlebotomy procedure started at 1135 and ended at 1143. 18G to L AC, 499 grams removed. Patient observed for 30 minutes after procedure without any incident. Food and drink provided. Patient tolerated procedure well. IV needle removed intact.

## 2023-02-28 NOTE — Patient Instructions (Signed)

## 2023-03-03 ENCOUNTER — Telehealth: Payer: Self-pay | Admitting: Oncology

## 2023-03-03 NOTE — Telephone Encounter (Signed)
Left a message regarding patients upcoming appointment times/dates.

## 2023-03-07 ENCOUNTER — Inpatient Hospital Stay: Payer: BC Managed Care – PPO

## 2023-03-07 ENCOUNTER — Inpatient Hospital Stay: Payer: BC Managed Care – PPO | Admitting: Oncology

## 2023-03-20 ENCOUNTER — Encounter (HOSPITAL_COMMUNITY): Payer: Self-pay

## 2023-03-20 ENCOUNTER — Other Ambulatory Visit: Payer: Self-pay | Admitting: Radiology

## 2023-03-20 DIAGNOSIS — D45 Polycythemia vera: Secondary | ICD-10-CM

## 2023-03-20 NOTE — H&P (Signed)
Chief Complaint: Polycythemia vera.  Referring Physician(s): Ribakove,Everett C  Supervising Physician: Marliss Coots  Patient Status: [facility] - Out-pt  History of Present Illness: Linda Ross is a 47 y.o. female outpatient. History of  uterine fibroids, chronic back pain. Found to have polycythemia vera. Team is requesting a bone marrow biopsy for further evaluation of fibrosis and molecular testing.   Husband at bedside. Currently without any significant complaints. Patient alert and laying in bed,calm. Denies any fevers, headache, chest pain, SOB, cough, abdominal pain, nausea, vomiting or bleeding. Return precautions and treatment recommendations and follow-up discussed with the patient and her husband who is agreeable with the plan.    Past Medical History:  Diagnosis Date   Chronic back pain    PCL injury    Pre-diabetes    Prediabetes     Past Surgical History:  Procedure Laterality Date   WISDOM TOOTH EXTRACTION      Allergies: Guaifenesin & derivatives  Medications: Prior to Admission medications   Medication Sig Start Date End Date Taking? Authorizing Provider  acetaminophen (TYLENOL) 500 MG tablet Take 500 mg by mouth every 6 (six) hours as needed.    [provider]  aspirin EC 81 MG tablet Take 81 mg by mouth daily. Swallow whole.    [provider]  Cholecalciferol (VITAMIN D3) 1.25 MG (50000 UT) CAPS Take 50,000 Units by mouth once a week. 06/07/20   [provider]  Multiple Vitamins-Minerals (PX COMPLETE SENIOR MULTIVITS) TABS Take 1 tablet by mouth daily.    [provider]  Omega-3 1000 MG CAPS Take 1,000 mg by mouth daily.    [provider]  vitamin B-12 (CYANOCOBALAMIN) 1000 MCG tablet Take 1,000 mcg by mouth daily.    [provider]     Family History  Problem Relation Age of Onset   Hypertension Mother    Cancer Father        prostate   Hypertension Father    Heart failure  Father    Diabetes Father    Kidney failure Father     Social History   Socioeconomic History   Marital status: Married    Spouse name: Not on file   Number of children: Not on file   Years of education: Not on file   Highest education level: Not on file  Occupational History   Not on file  Tobacco Use   Smoking status: Never   Smokeless tobacco: Never  Vaping Use   Vaping status: Never Used  Substance and Sexual Activity   Alcohol use: No   Drug use: No   Sexual activity: Yes    Birth control/protection: None  Other Topics Concern   Not on file  Social History Narrative   Not on file   Social Determinants of Health   Financial Resource Strain: Not on file  Food Insecurity: Not on file  Transportation Needs: Not on file  Physical Activity: Not on file  Stress: Not on file  Social Connections: Not on file   Review of Systems: A 12 point ROS discussed and pertinent positives are indicated in the HPI above.  All other systems are negative.  Review of Systems  Constitutional:  Negative for fatigue and fever.  HENT:  Negative for congestion.   Respiratory:  Negative for cough and shortness of breath.   Gastrointestinal:  Negative for abdominal pain, diarrhea, nausea and vomiting.    Vital Signs: BP 121/84   Pulse 81   Temp 98.4 F (  36.9 C) (Oral)   Resp 16   Ht 5\' 8"  (1.727 m)   Wt 190 lb (86.2 kg)   SpO2 97%   BMI 28.89 kg/m     Physical Exam Vitals and nursing note reviewed.  Constitutional:      Appearance: She is well-developed.  HENT:     Head: Normocephalic and atraumatic.  Eyes:     Conjunctiva/sclera: Conjunctivae normal.  Cardiovascular:     Rate and Rhythm: Normal rate and regular rhythm.  Pulmonary:     Effort: Pulmonary effort is normal.  Musculoskeletal:        General: Normal range of motion.     Cervical back: Normal range of motion.  Skin:    General: Skin is warm and dry.  Neurological:     General: No focal deficit present.      Mental Status: She is alert and oriented to person, place, and time.  Psychiatric:        Mood and Affect: Mood normal.        Behavior: Behavior normal.        Thought Content: Thought content normal.        Judgment: Judgment normal.     Imaging: No results found.  Labs:  CBC: Recent Labs    01/17/23 1640 01/24/23 1002 02/28/23 1009 03/21/23 0720  WBC 5.7 5.1 4.5 5.5  HGB 16.9* 16.6* 15.5* 15.0  HCT 49.4* 47.8* 46.2* 45.8  PLT 565* 562* 540* 607*    COAGS: No results for input(s): "INR", "APTT" in the last 8760 hours.  BMP: Recent Labs    01/17/23 1640 01/24/23 1002  NA 140 138  K 4.8 4.4  CL 104 106  CO2 25 28  GLUCOSE 84 90  BUN 13 18  CALCIUM 9.9 9.4  CREATININE 0.84 0.96  GFRNONAA  --  >60    LIVER FUNCTION TESTS: Recent Labs    01/17/23 1640 01/24/23 1002  BILITOT 0.8 0.6  AST 15 15  ALT 12 8  ALKPHOS  --  33*  PROT 7.7 7.7  ALBUMIN  --  4.3    Assessment and Plan:  47 y.o. female outpatient. History of  uterine fibroids, chronic back pain. Found to have polycythemia vera. Team is requesting a bone marrow biopsy for further evaluation of fibrosis and molecular testing.   Labs pending. Patient is on 81 mg of ASA. All other labs and medications are within acceptable parameters. No pertinent allergies.   Risks and benefits of bone marrow biopsy  was discussed with the patient and/or patient's family including, but not limited to bleeding, infection, damage to adjacent structures or low yield requiring additional tests.  All of the questions were answered and there is agreement to proceed.  Consent signed and in chart.   Thank you for this interesting consult.  I greatly enjoyed meeting Linda Ross and look forward to participating in their care.  A copy of this report was sent to the requesting provider on this date.  Electronically Signed: Alene Mires, NP 03/21/2023, 8:00 AM   I spent a total of  30 Minutes   in  face to face in clinical consultation, greater than 50% of which was counseling/coordinating care for bone marrow biopsy

## 2023-03-21 ENCOUNTER — Encounter (HOSPITAL_COMMUNITY): Payer: Self-pay

## 2023-03-21 ENCOUNTER — Ambulatory Visit (HOSPITAL_COMMUNITY)
Admission: RE | Admit: 2023-03-21 | Discharge: 2023-03-21 | Disposition: A | Discharge status: Home / Self Care | Payer: BC Managed Care – PPO | Source: Ambulatory Visit | Attending: Oncology | Admitting: Oncology

## 2023-03-21 ENCOUNTER — Other Ambulatory Visit: Payer: Self-pay

## 2023-03-21 DIAGNOSIS — D75839 Thrombocytosis, unspecified: Secondary | ICD-10-CM | POA: Diagnosis not present

## 2023-03-21 DIAGNOSIS — D45 Polycythemia vera: Secondary | ICD-10-CM | POA: Insufficient documentation

## 2023-03-21 LAB — CBC WITH DIFFERENTIAL/PLATELET
Abs Immature Granulocytes: 0.01 10*3/uL (ref 0.00–0.07)
Basophils Absolute: 0 10*3/uL (ref 0.0–0.1)
Basophils Relative: 0 %
Eosinophils Absolute: 0.2 10*3/uL (ref 0.0–0.5)
Eosinophils Relative: 3 %
HCT: 45.8 % (ref 36.0–46.0)
Hemoglobin: 15 g/dL (ref 12.0–15.0)
Immature Granulocytes: 0 %
Lymphocytes Relative: 42 %
Lymphs Abs: 2.3 10*3/uL (ref 0.7–4.0)
MCH: 29.6 pg (ref 26.0–34.0)
MCHC: 32.8 g/dL (ref 30.0–36.0)
MCV: 90.3 fL (ref 80.0–100.0)
Monocytes Absolute: 0.4 10*3/uL (ref 0.1–1.0)
Monocytes Relative: 8 %
Neutro Abs: 2.6 10*3/uL (ref 1.7–7.7)
Neutrophils Relative %: 47 %
Platelets: 607 10*3/uL — ABNORMAL HIGH (ref 150–400)
RBC: 5.07 MIL/uL (ref 3.87–5.11)
RDW: 13.5 % (ref 11.5–15.5)
WBC: 5.5 10*3/uL (ref 4.0–10.5)
nRBC: 0 % (ref 0.0–0.2)

## 2023-03-21 MED ORDER — LIDOCAINE-EPINEPHRINE 1 %-1:100000 IJ SOLN
INTRAMUSCULAR | Status: AC | PRN
  Administered 2023-03-21: 10 mL via INTRADERMAL

## 2023-03-21 MED ORDER — MIDAZOLAM HCL 2 MG/2ML IJ SOLN
INTRAMUSCULAR | Status: AC
  Filled 2023-03-21: qty 4

## 2023-03-21 MED ORDER — PREDNISONE 20 MG PO TABS: 20.0000 mg | ORAL_TABLET | Freq: Once | ORAL | Status: DC

## 2023-03-21 MED ORDER — MIDAZOLAM HCL 2 MG/2ML IJ SOLN
INTRAMUSCULAR | Status: AC | PRN
  Administered 2023-03-21: 1 mg via INTRAVENOUS

## 2023-03-21 MED ORDER — FENTANYL CITRATE (PF) 100 MCG/2ML IJ SOLN
INTRAMUSCULAR | Status: AC | PRN
  Administered 2023-03-21: 50 ug via INTRAVENOUS

## 2023-03-21 MED ORDER — FENTANYL CITRATE (PF) 100 MCG/2ML IJ SOLN
INTRAMUSCULAR | Status: AC
  Filled 2023-03-21: qty 2

## 2023-03-21 MED ORDER — FLUMAZENIL 0.5 MG/5ML IV SOLN
INTRAVENOUS | Status: AC
  Filled 2023-03-21: qty 5

## 2023-03-21 MED ORDER — SODIUM CHLORIDE 0.9 % IV SOLN: INTRAVENOUS | Status: DC

## 2023-03-21 MED ORDER — NALOXONE HCL 0.4 MG/ML IJ SOLN
INTRAMUSCULAR | Status: AC
  Filled 2023-03-21: qty 1

## 2023-03-21 NOTE — Discharge Instructions (Signed)
Please call Interventional Radiology clinic [phone] with any questions or concerns. ? ?You may remove your dressing and shower tomorrow. ? ? ?Bone Marrow Aspiration and Bone Marrow Biopsy, Adult, Care After ?This sheet gives you information about how to care for yourself after your procedure. Your health care provider may also give you more specific instructions. If you have problems or questions, contact your health care provider. ?What can I expect after the procedure? ?After the procedure, it is common to have: ?Mild pain and tenderness. ?Swelling. ?Bruising. ?Follow these instructions at home: ?Puncture site care ?Follow instructions from your health care provider about how to take care of the puncture site. Make sure you: ?Wash your hands with soap and water before and after you change your bandage (dressing). If soap and water are not available, use hand sanitizer. ?Change your dressing as told by your health care provider. ?Check your puncture site every day for signs of infection. Check for: ?More redness, swelling, or pain. ?Fluid or blood. ?Warmth. ?Pus or a bad smell.   ?Activity ?Return to your normal activities as told by your health care provider. Ask your health care provider what activities are safe for you. ?Do not lift anything that is heavier than 10 lb (4.5 kg), or the limit that you are told, until your health care provider says that it is safe. ?Do not drive for 24 hours if you were given a sedative during your procedure. ?General instructions ?Take over-the-counter and prescription medicines only as told by your health care provider. ?Do not take baths, swim, or use a hot tub until your health care provider approves. Ask your health care provider if you may take showers. You may only be allowed to take sponge baths. ?If directed, put ice on the affected area. To do this: ?Put ice in a plastic bag. ?Place a towel between your skin and the bag. ?Leave the ice on for 20 minutes, 2-3 times a  day. ?Keep all follow-up visits as told by your health care provider. This is important.   ?Contact a health care provider if: ?Your pain is not controlled with medicine. ?You have a fever. ?You have more redness, swelling, or pain around the puncture site. ?You have fluid or blood coming from the puncture site. ?Your puncture site feels warm to the touch. ?You have pus or a bad smell coming from the puncture site. ?Summary ?After the procedure, it is common to have mild pain, tenderness, swelling, and bruising. ?Follow instructions from your health care provider about how to take care of the puncture site and what activities are safe for you. ?Take over-the-counter and prescription medicines only as told by your health care provider. ?Contact a health care provider if you have any signs of infection, such as fluid or blood coming from the puncture site. ?This information is not intended to replace advice given to you by your health care provider. Make sure you discuss any questions you have with your health care provider. ?Document Revised: 12/22/2018 Document Reviewed: 12/22/2018 ?Elsevier Patient Education ? 2021 Elsevier Inc[employer] ? ? ?Moderate Conscious Sedation, Adult, Care After ?This sheet gives you information about how to care for yourself after your procedure. Your health care provider may also give you more specific instructions. If you have problems or questions, contact your health care provider. ?What can I expect after the procedure? ?After the procedure, it is common to have: ?Sleepiness for several hours. ?Impaired judgment for several hours. ?Difficulty with balance. ?Vomiting if you eat too   soon. ?Follow these instructions at home: ?For the time period you were told by your health care provider: ?Rest. ?Do not participate in activities where you could fall or become injured. ?Do not drive or use machinery. ?Do not drink alcohol. ?Do not take sleeping pills or medicines that cause drowsiness. ?Do not  make important decisions or sign legal documents. ?Do not take care of children on your own.  ?  ?  ?Eating and drinking ?Follow the diet recommended by your health care provider. ?Drink enough fluid to keep your urine pale yellow. ?If you vomit: ?Drink water, juice, or soup when you can drink without vomiting. ?Make sure you have little or no nausea before eating solid foods.   ?General instructions ?Take over-the-counter and prescription medicines only as told by your health care provider. ?Have a responsible adult stay with you for the time you are told. It is important to have someone help care for you until you are awake and alert. ?Do not smoke. ?Keep all follow-up visits as told by your health care provider. This is important. ?Contact a health care provider if: ?You are still sleepy or having trouble with balance after 24 hours. ?You feel light-headed. ?You keep feeling nauseous or you keep vomiting. ?You develop a rash. ?You have a fever. ?You have redness or swelling around the IV site. ?Get help right away if: ?You have trouble breathing. ?You have new-onset confusion at home. ?Summary ?After the procedure, it is common to feel sleepy, have impaired judgment, or feel nauseous if you eat too soon. ?Rest after you get home. Know the things you should not do after the procedure. ?Follow the diet recommended by your health care provider and drink enough fluid to keep your urine pale yellow. ?Get help right away if you have trouble breathing or new-onset confusion at home. ?This information is not intended to replace advice given to you by your health care provider. Make sure you discuss any questions you have with your health care provider. ?Document Revised: 12/03/2019 Document Reviewed: 07/01/2019 ?Elsevier Patient Education ? 2021 Elsevier Inc.  ?

## 2023-03-21 NOTE — Procedures (Signed)
Interventional Radiology Procedure Note  Procedure: CT guided aspirate and core biopsy of right iliac bone  Complications: None  Recommendations: - Bedrest supine x 1 hrs - Hydrocodone PRN  Pain - Follow biopsy results    , MD   

## 2023-03-24 DIAGNOSIS — D259 Leiomyoma of uterus, unspecified: Secondary | ICD-10-CM | POA: Diagnosis not present

## 2023-03-24 DIAGNOSIS — N951 Menopausal and female climacteric states: Secondary | ICD-10-CM | POA: Diagnosis not present

## 2023-03-24 DIAGNOSIS — N926 Irregular menstruation, unspecified: Secondary | ICD-10-CM | POA: Diagnosis not present

## 2023-03-26 DIAGNOSIS — Z0000 Encounter for general adult medical examination without abnormal findings: Secondary | ICD-10-CM | POA: Diagnosis not present

## 2023-03-26 DIAGNOSIS — N951 Menopausal and female climacteric states: Secondary | ICD-10-CM | POA: Diagnosis not present

## 2023-03-27 ENCOUNTER — Encounter: Payer: Self-pay | Admitting: Oncology

## 2023-03-28 ENCOUNTER — Encounter (HOSPITAL_COMMUNITY): Payer: Self-pay | Admitting: Oncology

## 2023-04-10 NOTE — Progress Notes (Deleted)
[facility] Cancer Follow up Visit:  Patient Care Team: Gerre Scull, NP as PCP - General (Internal Medicine) Silverio Lay, MD as Consulting Physician (Obstetrics and Gynecology)  CHIEF COMPLAINTS/PURPOSE OF CONSULTATION:  Oncology History  Polycythemia vera (HCC)  01/24/2023 Initial Diagnosis   Polycythemia vera (HCC)     HISTORY OF PRESENTING ILLNESS: Linda Ross 47 y.o. female is here because of polycythemia vera Medical history includes uterine fibroids January 30, 2022: WBC 5.6 hemoglobin 15.4 platelet count 584; 51 segs 40 lymphs 8 monos 1 EO 1 basophil March 01, 2022: JAK2 panel demonstrated JAK2 V6 46F mutation  Jan 17, 2023: WBC 5.7 hemoglobin 16.9 platelet count 565; 48 segs 7 lymphs 3 monos  January 24 2023:  No fevers, chills.  No longer having continuous HA's as she was having in 2023.  Has fatigue.  No aquagenic pruritus.  Occasional leg and back pain.  Still has uterine fibroids for which she is seeing gynecology for consideration of Lupron followed by myomectomy.  No plans for more children.    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  Tobacco none.  EtOH rare.    WBC 5.1 hemoglobin 16.6 platelet count 562; 50 segs 30 lymphs 8 monos 3 eos 1 basophil. Ferritin 32 LDH 216  February 04 2023:  Therapeutic phlebotomy  February 28 2023:  Scheduled follow up for MPN.    For therapeutic phlebotomy.  Feels well.    WBC 4.5 hemoglobin 15.5 platelet count 540; 47 segs 42 lymphs 7 monos 4 eos   Review of Systems - Oncology  MEDICAL HISTORY: Past Medical History:  Diagnosis Date   Chronic back pain    PCL injury    Pre-diabetes    Prediabetes     SURGICAL HISTORY: Past Surgical History:  Procedure Laterality Date   WISDOM TOOTH EXTRACTION      SOCIAL HISTORY: Social History   Socioeconomic History   Marital status: Married    Spouse name: Not on file   Number of children: Not on file   Years of education: Not  on file   Highest education level: Not on file  Occupational History   Not on file  Tobacco Use   Smoking status: Never   Smokeless tobacco: Never  Vaping Use   Vaping status: Never Used  Substance and Sexual Activity   Alcohol use: No   Drug use: No   Sexual activity: Yes    Birth control/protection: None  Other Topics Concern   Not on file  Social History Narrative   Not on file   Social Determinants of Health   Financial Resource Strain: Not on file  Food Insecurity: Not on file  Transportation Needs: Not on file  Physical Activity: Not on file  Stress: Not on file  Social Connections: Not on file  Intimate Partner Violence: Not on file    FAMILY HISTORY Family History  Problem Relation Age of Onset   Hypertension Mother    Cancer Father        prostate   Hypertension Father    Heart failure Father    Diabetes Father    Kidney failure Father     ALLERGIES:  is allergic to guaifenesin & derivatives.  MEDICATIONS:  Current Outpatient Medications  Medication Sig Dispense Refill   acetaminophen (TYLENOL) 500 MG tablet Take 500 mg by mouth every 6 (six) hours as needed.     aspirin EC 81 MG tablet Take 81 mg by  mouth daily. Swallow whole.     Cholecalciferol (VITAMIN D3) 1.25 MG (50000 UT) CAPS Take 50,000 Units by mouth once a week.     Multiple Vitamins-Minerals (PX COMPLETE SENIOR MULTIVITS) TABS Take 1 tablet by mouth daily.     Omega-3 1000 MG CAPS Take 1,000 mg by mouth daily.     vitamin B-12 (CYANOCOBALAMIN) 1000 MCG tablet Take 1,000 mcg by mouth daily.     No current facility-administered medications for this visit.    PHYSICAL EXAMINATION:  ECOG PERFORMANCE STATUS: 0 - Asymptomatic   There were no vitals filed for this visit.   There were no vitals filed for this visit.    Physical Exam Vitals and nursing note reviewed.  Constitutional:      General: She is not in acute distress.    Appearance: Normal appearance. She is normal weight.  She is not ill-appearing, toxic-appearing or diaphoretic.     Comments: Here alone.    HENT:     Head: Normocephalic and atraumatic.     Right Ear: External ear normal.     Left Ear: External ear normal.     Nose: Nose normal. No congestion or rhinorrhea.  Eyes:     General: No scleral icterus.    Extraocular Movements: Extraocular movements intact.     Conjunctiva/sclera: Conjunctivae normal.     Pupils: Pupils are equal, round, and reactive to light.  Cardiovascular:     Rate and Rhythm: Normal rate and regular rhythm.     Heart sounds: No murmur heard.    No friction rub. No gallop.  Pulmonary:     Effort: Pulmonary effort is normal. No respiratory distress.     Breath sounds: Normal breath sounds.  Abdominal:     General: Bowel sounds are normal. There is no distension.     Palpations: Abdomen is soft. There is no mass.     Tenderness: There is no abdominal tenderness. There is no guarding or rebound.  Musculoskeletal:        General: No swelling, tenderness or deformity.     Cervical back: Normal range of motion and neck supple. No rigidity or tenderness.     Right lower leg: No edema.     Left lower leg: No edema.  Lymphadenopathy:     Head:     Right side of head: No submental, submandibular, tonsillar, preauricular, posterior auricular or occipital adenopathy.     Left side of head: No submental, submandibular, tonsillar, preauricular, posterior auricular or occipital adenopathy.     Cervical: No cervical adenopathy.     Right cervical: No superficial, deep or posterior cervical adenopathy.    Left cervical: No superficial, deep or posterior cervical adenopathy.     Upper Body:     Right upper body: No supraclavicular, axillary, pectoral or epitrochlear adenopathy.     Left upper body: No supraclavicular, axillary, pectoral or epitrochlear adenopathy.  Skin:    General: Skin is warm.     Coloration: Skin is not jaundiced.     Findings: No lesion.  Neurological:      General: No focal deficit present.     Mental Status: She is alert and oriented to person, place, and time.     Cranial Nerves: No cranial nerve deficit.  Psychiatric:        Mood and Affect: Mood normal.        Behavior: Behavior normal.        Thought Content: Thought content normal.  Judgment: Judgment normal.      LABORATORY DATA: I have personally reviewed the data as listed:  Hospital Outpatient Visit on 03/21/2023  Component Date Value Ref Range Status   WBC 03/21/2023 5.5  4.0 - 10.5 K/uL Final   RBC 03/21/2023 5.07  3.87 - 5.11 MIL/uL Final   Hemoglobin 03/21/2023 15.0  12.0 - 15.0 g/dL Final   HCT 09/81/1914 45.8  36.0 - 46.0 % Final   MCV 03/21/2023 90.3  80.0 - 100.0 fL Final   MCH 03/21/2023 29.6  26.0 - 34.0 pg Final   MCHC 03/21/2023 32.8  30.0 - 36.0 g/dL Final   RDW 78/29/5621 13.5  11.5 - 15.5 % Final   Platelets 03/21/2023 607 (H)  150 - 400 K/uL Final   nRBC 03/21/2023 0.0  0.0 - 0.2 % Final   Neutrophils Relative % 03/21/2023 47  % Final   Neutro Abs 03/21/2023 2.6  1.7 - 7.7 K/uL Final   Lymphocytes Relative 03/21/2023 42  % Final   Lymphs Abs 03/21/2023 2.3  0.7 - 4.0 K/uL Final   Monocytes Relative 03/21/2023 8  % Final   Monocytes Absolute 03/21/2023 0.4  0.1 - 1.0 K/uL Final   Eosinophils Relative 03/21/2023 3  % Final   Eosinophils Absolute 03/21/2023 0.2  0.0 - 0.5 K/uL Final   Basophils Relative 03/21/2023 0  % Final   Basophils Absolute 03/21/2023 0.0  0.0 - 0.1 K/uL Final   Immature Granulocytes 03/21/2023 0  % Final   Abs Immature Granulocytes 03/21/2023 0.01  0.00 - 0.07 K/uL Final   Performed at [facility], [address], [place], [place] [address]   SURGICAL PATHOLOGY 03/21/2023    Final-Edited                   Value:Surgical Pathology CASE: [phone] PATIENT: [facility] Ross Bone Marrow Report     Clinical History: Polycythemia vera     DIAGNOSIS:  BONE MARROW, ASPIRATE, CLOT, CORE: -  Mildly hypercellular bone marrow (60%) involved by JAK2 mutated myeloid neoplasm.  See comment.  PERIPHERAL BLOOD: -Thrombocytosis  COMMENT:  The overall findings of persistent and progressive peripheral blood thrombocytosis, elevated hemoglobin along with proliferation and atypia of megakaryocytes in the bone marrow; and positive JAK2-mutation testing are consistent with a myeloproliferative neoplasm. There is no overt increase in blasts or fibrosis.  However, correlation with erythropoietin levels, iron studies, presence/absence of splenomegaly and serum LDH, cytogenetic studies as well as interim history is recommended for further characterization.  MICROSCOPIC DESCRIPTION:  PERIPHERAL BLOOD SMEAR: Platelets: Increased, rare giant platelets present Erythroid: Normocyt                         ic normochromic red blood cells Leukocytes: Adequate, negative for dysplastic granulocytes or blasts  BONE MARROW ASPIRATE: Cellular Erythroid precursors: Erythroid precursors show a full sequence of generally orderly maturation Granulocytic precursors: Granulocytic precursors show full sequence of generally orderly maturation Megakaryocytes: Increased, with abundant abnormally lobated forms Lymphocytes/plasma cells: Not increased  TOUCH PREPARATIONS: Cellular, confirmatory of the aspirate findings.  CLOT AND BIOPSY: Bone marrow clot and biopsy show a mildly hypercellular bone marrow (60%) with trilineage hematopoiesis.  Megakaryocytes are increased and show abundant hyper- and hypolobulated forms.  Blasts are not increased.  The findings are confirmatory of the aspirate and touch prep impression. SPECIAL STAINS: CD34: CD34 highlights vasculature and blasts, approximately 1% of cellularity CD117: CD117 highlights immature mononuclear cells MPO: MPO highlights  myel                         oid precursors E-cadherin: E-cadherin highlights erythroid precursors Reticulin/trichrome:  Reticulin/trichrome reveals scattered linear reticulin with focal intersections, and no collagen fibrosis (MF 0-1) IRON STAIN: Iron stains are performed on a bone marrow aspirate or touch imprint smear and section of clot. The controls stained appropriately.       Storage Iron: Scant      Ring Sideroblasts: Not identified  ADDITIONAL DATA/TESTING: Cytogenetics  CELL COUNT DATA:  Bone Marrow count performed on 500 cells shows: Blasts:   0%   Myeloid:  49% Promyelocytes: 0%   Erythroid:     35% Myelocytes:    5%   Lymphocytes:   11% Metamyelocytes:     9%   Plasma cells:  4% Bands:    9% Neutrophils:   23.% M:E ratio:     1.4 Eosinophils:   3% Basophils:     0% Monocytes:     1%  Lab Data: CBC performed on 03/21/2023 shows: WBC: 5.5 k/uL  Neutrophils:   38% Hgb: 15 g/dL   Lymphocytes:   62% HCT: 45.8 %    Monocytes:     8% MCV: 90.3 fL   Eosinophils:   1% RDW: 13.5 %                             Basophils:     0% PLT: 607 k/uL    GROSS DESCRIPTION:  A: Aspirate smear  B: Received in B-plus fixative are tissue fragments measuring 0.6 x 0.4 x 0.2 cm in aggregate.  The specimen is submitted in toto.  C: Received in B-plus fixative are 2 cores of bone measuring 1.0 and 1.3 cm in length and each 0.2 cm in diameter.  The specimen is submitted in toto following decalcification.  [facility] 03/21/2023)   Final Diagnosis performed by Clifton James, MD.   Electronically signed 03/25/2023 Technical and / or Professional components performed at [facility], [address] [place], [place] [address].  Immunohistochemistry Technical component (if applicable) was performed at [facility]. [address], [address], [place], [place] [address].   IMMUNOHISTOCHEMISTRY DISCLAIMER (if applicable): Some of these immunohistochemical stains may have been developed and the performance characteristics determine by [facility]. Some may                           not have been cleared or approved by the U.S. Food and Drug Administration. The FDA has determined that such clearance or approval is not necessary. This test is used for clinical purposes. It should not be regarded as investigational or for research. This laboratory is certified under the Clinical Laboratory Improvement Amendments of 1988 (CLIA-88) as qualified to perform high complexity clinical laboratory testing.  The controls stained appropriately.   IHC stains are performed on formalin fixed, paraffin embedded tissue using a 3,3"diaminobenzidine (DAB) chromogen and Leica Bond Autostainer System. The staining intensity of the nucleus is score manually and is reported as the percentage of tumor cell nuclei demonstrating specific nuclear staining. The specimens are fixed in 10% Neutral Formalin for at least 6 hours and up to 72hrs. These tests are validated on decalcified tissue. Results should be interpreted with caution given the possibility of false negative results  on decalcified specimens. Antibody Clones are as follows ER-clone 69F, PR-clone 16, Ki67- clone MM1. Some of these immunohistochemical stains may have been developed and the performance characteristics determined by [facility].     RADIOGRAPHIC STUDIES: I have personally reviewed the radiological images as listed and agree with the findings in the report  No results found.  ASSESSMENT/PLAN  47 y.o. 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  Medical history includes uterine fibroids  Jak 2 MPN  January 30, 2022: WBC 5.6 hemoglobin 15.4 platelet count 584; 51 segs 40 lymphs 8 monos 1 EO 1 basophil March 01, 2022: JAK2 panel demonstrated JAK2 V6 41F mutation  Jan 17, 2023: WBC 5.7 hemoglobin 16.9 platelet count 565; 48 segs 7 lymphs 3 monos January 24 2023- Recommend that patient undergo bone marrow bx and aspirate to obtain baseline, evaluate for fibrosis and obtain material  for additional molecular testing.  Explained the pathophysiology and management of MPN's with patient  February 28 2023:  Hgb 15.5 PLT 540.  To continue therapeutic phlebotomies and undergo bone marrow bx   Therapeutics  January 24 2023- Begin ASA 81 mg daily and therapeutic phlebotomies to decrease Hgb    Cancer Staging  No matching staging information was found for the patient.    No problem-specific Assessment & Plan notes found for this encounter.    No orders of the defined types were placed in this encounter.   30  minutes was spent in patient care.  This included time spent preparing to see the patient (e.g., review of tests), obtaining and/or reviewing separately obtained history, counseling and educating the patient/family/caregiver, ordering medications, tests, or procedures; documenting clinical information in the electronic or other health record, independently interpreting results and communicating results to the patient/family/caregiver as well as coordination of care.       All questions were answered. The patient knows to call the clinic with any problems, questions or concerns.  This note was electronically signed.    Loni Muse, MD  04/10/2023 2:35 PM

## 2023-04-11 ENCOUNTER — Inpatient Hospital Stay: Payer: BC Managed Care – PPO | Attending: Oncology

## 2023-04-11 ENCOUNTER — Inpatient Hospital Stay: Payer: BC Managed Care – PPO | Admitting: Oncology

## 2023-04-30 DIAGNOSIS — N951 Menopausal and female climacteric states: Secondary | ICD-10-CM | POA: Diagnosis not present

## 2023-04-30 DIAGNOSIS — D259 Leiomyoma of uterus, unspecified: Secondary | ICD-10-CM | POA: Diagnosis not present

## 2023-06-03 DIAGNOSIS — D751 Secondary polycythemia: Secondary | ICD-10-CM | POA: Diagnosis not present

## 2023-06-03 DIAGNOSIS — Z1231 Encounter for screening mammogram for malignant neoplasm of breast: Secondary | ICD-10-CM | POA: Diagnosis not present

## 2023-06-03 DIAGNOSIS — D259 Leiomyoma of uterus, unspecified: Secondary | ICD-10-CM | POA: Diagnosis not present

## 2023-06-03 DIAGNOSIS — Z1330 Encounter for screening examination for mental health and behavioral disorders, unspecified: Secondary | ICD-10-CM | POA: Diagnosis not present

## 2023-06-03 DIAGNOSIS — Z01411 Encounter for gynecological examination (general) (routine) with abnormal findings: Secondary | ICD-10-CM | POA: Diagnosis not present

## 2023-07-03 DIAGNOSIS — M7541 Impingement syndrome of right shoulder: Secondary | ICD-10-CM | POA: Diagnosis not present

## 2023-07-03 DIAGNOSIS — M25511 Pain in right shoulder: Secondary | ICD-10-CM | POA: Diagnosis not present

## 2023-07-04 ENCOUNTER — Other Ambulatory Visit (HOSPITAL_BASED_OUTPATIENT_CLINIC_OR_DEPARTMENT_OTHER): Payer: Self-pay | Admitting: Physician Assistant

## 2023-07-04 DIAGNOSIS — M7541 Impingement syndrome of right shoulder: Secondary | ICD-10-CM

## 2023-07-31 ENCOUNTER — Ambulatory Visit (HOSPITAL_BASED_OUTPATIENT_CLINIC_OR_DEPARTMENT_OTHER): Payer: BC Managed Care – PPO

## 2023-08-08 ENCOUNTER — Ambulatory Visit (HOSPITAL_BASED_OUTPATIENT_CLINIC_OR_DEPARTMENT_OTHER): Payer: BC Managed Care – PPO

## 2023-08-18 ENCOUNTER — Encounter: Payer: Self-pay | Admitting: Oncology

## 2023-08-22 ENCOUNTER — Ambulatory Visit (HOSPITAL_BASED_OUTPATIENT_CLINIC_OR_DEPARTMENT_OTHER): Payer: BC Managed Care – PPO

## 2023-08-22 ENCOUNTER — Telehealth: Payer: Self-pay

## 2023-09-08 NOTE — Progress Notes (Deleted)
Patient Care Team: Gerre Scull, NP as PCP - General (Internal Medicine) Silverio Lay, MD as Consulting Physician (Obstetrics and Gynecology)  Clinic Day:  09/09/2023  Referring physician: Gerre Scull, NP  ASSESSMENT & PLAN:   Assessment & Plan: 48 y.o.female with history of *** here for follow up for MPN with thrombocytosis, likely ET. (Megakaryocytes are increased and show abundant hyper- and hypolobulated forms.)  ET risk:  Low risk. Age <=60 years, with JAK2 mutation, no prior history of thrombosis  MPN (myeloproliferative neoplasm) (HCC) Assessment & Plan: ET Low risk: Manage cardiovascular risk factors Monitor for thrombosis Aspirin 81 mg daily Will repeat labs in about 3 months      The patient understands the plans discussed today and is in agreement with them.  She knows to contact our office if she develops concerns prior to her next appointment.  Melven Sartorius, MD  [facility] - A DEPT OF [facility] [address] [place] [address] Dept: [phone] Dept Fax: [phone]   No orders of the defined types were placed in this encounter.     CHIEF COMPLAINT:  CC: ***  Current Treatment:  ***  INTERVAL HISTORY:  Linda Ross is here today for follow up. She is feeling   PV history 01/30/2022: WBC 5.6 hemoglobin 15.4 platelet count 584; 51 segs 40 lymphs 8 monos 1 EO 1 basophil 7/14/ 2023: JAK2 panel demonstrated JAK2 V617F mutation 01/17/2023: WBC 5.7 hemoglobin 16.9 platelet count 565; 48 segs 7 lymphs 3 monos 03/21/23 BONE MARROW, ASPIRATE, CLOT, CORE:  - Mildly hypercellular bone marrow (60%) involved by JAK2 mutated myeloid neoplasm  BONE MARROW ASPIRATE: Cellular  Erythroid precursors: Erythroid precursors show a full sequence of generally orderly maturation  Granulocytic precursors: Granulocytic precursors show full sequence of generally orderly maturation  Megakaryocytes:  Increased, with abundant abnormally lobated forms  Lymphocytes/plasma cells: Not increased  Megakaryocytes are increased and show abundant hyper- and hypolobulated forms.  02/28/2023:  Hgb 15.5 PLT 540.  To continue therapeutic phlebotomies and undergo bone marrow bx     Therapeutics             January 24 2023- Begin ASA 81 mg daily and therapeutic phlebotomies to decrease Hgb   Past Medical History:  Diagnosis Date   Chronic back pain    PCL injury    Pre-diabetes    Prediabetes    Past Surgical History:  Procedure Laterality Date   WISDOM TOOTH EXTRACTION      ALLERGIES:  is allergic to guaifenesin & derivatives.  MEDICATIONS:  Current Outpatient Medications  Medication Sig Dispense Refill   acetaminophen (TYLENOL) 500 MG tablet Take 500 mg by mouth every 6 (six) hours as needed.     aspirin EC 81 MG tablet Take 81 mg by mouth daily. Swallow whole.     Cholecalciferol (VITAMIN D3) 1.25 MG (50000 UT) CAPS Take 50,000 Units by mouth once a week.     Multiple Vitamins-Minerals (PX COMPLETE SENIOR MULTIVITS) TABS Take 1 tablet by mouth daily.     Omega-3 1000 MG CAPS Take 1,000 mg by mouth daily.     vitamin B-12 (CYANOCOBALAMIN) 1000 MCG tablet Take 1,000 mcg by mouth daily.     No current facility-administered medications for this visit.    HISTORY OF PRESENT ILLNESS:   Past Medical History:  Diagnosis Date   Chronic back pain    PCL injury    Pre-diabetes  Prediabetes     REVIEW OF SYSTEMS:   All relevant systems were reviewed with the patient and are negative.   VITALS:  There were no vitals taken for this visit.  Wt Readings from Last 3 Encounters:  03/21/23 190 lb (86.2 kg)  02/28/23 193 lb 8 oz (87.8 kg)  01/24/23 192 lb 9.6 oz (87.4 kg)    There is no height or weight on file to calculate BMI.  Performance status (ECOG): {CHL ONC [id number]  PHYSICAL EXAM:   GENERAL: alert, no distress and comfortable SKIN: skin color normal, no jaundice EYES:  normal, sclera clear OROPHARYNX: no exudate, or lesions  NECK: supple, no palpable mass LYMPH:  no palpable cervical lymphadenopathy LUNGS: clear to auscultation with normal breathing effort.  No wheeze or Rales HEART: regular rate & rhythm and no lower extremity edema ABDOMEN: abdomen soft, non-tender and nondistended Musculoskeletal: No point tenderness NEURO: alert, no focal motor/sensory deficits  LABORATORY DATA:  I have reviewed the data as listed    Component Value Date/Time   NA 138 01/24/2023 1002   K 4.4 01/24/2023 1002   CL 106 01/24/2023 1002   CO2 28 01/24/2023 1002   GLUCOSE 90 01/24/2023 1002   BUN 18 01/24/2023 1002   CREATININE 0.96 01/24/2023 1002   CREATININE 0.84 01/17/2023 1640   CALCIUM 9.4 01/24/2023 1002   PROT 7.7 01/24/2023 1002   ALBUMIN 4.3 01/24/2023 1002   AST 15 01/24/2023 1002   ALT 8 01/24/2023 1002   ALKPHOS 33 (L) 01/24/2023 1002   BILITOT 0.6 01/24/2023 1002   GFRNONAA >60 01/24/2023 1002   GFRAA >60 12/04/2015 2249    No results found for: "SPEP", "UPEP"  Lab Results  Component Value Date   WBC 5.5 03/21/2023   NEUTROABS 2.6 03/21/2023   HGB 15.0 03/21/2023   HCT 45.8 03/21/2023   MCV 90.3 03/21/2023   PLT 607 (H) 03/21/2023      Chemistry      Component Value Date/Time   NA 138 01/24/2023 1002   K 4.4 01/24/2023 1002   CL 106 01/24/2023 1002   CO2 28 01/24/2023 1002   BUN 18 01/24/2023 1002   CREATININE 0.96 01/24/2023 1002   CREATININE 0.84 01/17/2023 1640      Component Value Date/Time   CALCIUM 9.4 01/24/2023 1002   ALKPHOS 33 (L) 01/24/2023 1002   AST 15 01/24/2023 1002   ALT 8 01/24/2023 1002   BILITOT 0.6 01/24/2023 1002       RADIOGRAPHIC STUDIES: I have personally reviewed the radiological images as listed and agreed with the findings in the report. No results found.

## 2023-09-09 NOTE — Assessment & Plan Note (Deleted)
ET Low risk: Manage cardiovascular risk factors Monitor for thrombosis Aspirin 81 mg daily Will repeat labs in about 3 months

## 2023-09-11 ENCOUNTER — Other Ambulatory Visit: Payer: Self-pay

## 2023-09-11 ENCOUNTER — Inpatient Hospital Stay: Payer: BC Managed Care – PPO

## 2023-09-11 ENCOUNTER — Inpatient Hospital Stay: Payer: BC Managed Care – PPO | Attending: Internal Medicine

## 2023-09-11 DIAGNOSIS — D471 Chronic myeloproliferative disease: Secondary | ICD-10-CM | POA: Insufficient documentation

## 2023-09-11 DIAGNOSIS — Z7982 Long term (current) use of aspirin: Secondary | ICD-10-CM | POA: Insufficient documentation

## 2023-09-11 DIAGNOSIS — D751 Secondary polycythemia: Secondary | ICD-10-CM | POA: Insufficient documentation

## 2023-09-15 ENCOUNTER — Telehealth: Payer: Self-pay

## 2023-09-15 NOTE — Telephone Encounter (Signed)
Rescheduled appointments per MyChart message. Patient is aware of the made appointments and is active on MyChart.

## 2023-09-17 NOTE — Progress Notes (Signed)
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 (Megakaryocytes are increased and show abundant hyper- and hypolobulated forms.)  ET risk:  Low risk. Age <=60 years, with JAK2 mutation, no prior history of thrombosis.  Long discussion today on MPN, diagnosis, bone marrow biopsy, management of MPN and ET. Discussed she is consider low risk without thrombosis history and her age. She is otherwise healthy. Discussed importance of healthy diet, lifestyle in terms of improve cardiovascular health. Given her hemoglobin has increased in a few months and previously benefit from phlebotomy, we can resume phlebotomies every 3-4 months and keep hemoglobin less than 45. She is not at high risk to warrant hydrea at this time but would continue aspirin daily. Discussed signs of DVT, PET, MI and CVA and report to ED if needed. Discussed long term monitoring is important. There is small risk of transformation to more significant disease like myelofibrosis or acute leukemia. Continue monitor blood counts and clinical status is recommended.  MPN (myeloproliferative neoplasm) [facility]) Assessment & Plan: ET Low risk: Manage cardiovascular risk factors Monitor for thrombosis Aspirin 81 mg daily Will repeat labs in about 3 months  Orders: -     CMP (Cancer Center only); Future -     CBC with Differential (Cancer Center Only); Future -     Lactate dehydrogenase; Future  Primary polycythemia (HCC) Assessment & Plan: Phlebotomy to keep HGB <15 Phlebotomy about every 3-4 months  Orders: -     CMP (Cancer Center only); Future -     CBC with Differential (Cancer Center Only); Future -     Lactate  dehydrogenase; Future  Start phlebotomy any day Return in about end of May with labs, MD visit and phlebotomy.   The patient understands the plans discussed today and is in agreement with them.  She knows to contact our office if she develops concerns prior to her next appointment.  Total time 51 minutes.  Melven Sartorius, MD  [facility] - [facility] DEPT [facility] [address] [place] [address] Dept: [phone] Dept Fax: [phone]   Orders Placed This Encounter  Procedures   CMP (Cancer Center only)    Standing Status:   Future    Expiration Date:   09/18/2024   CBC with Differential (Cancer Center Only)    Standing Status:   Future    Expiration Date:   09/18/2024   Lactate dehydrogenase    Standing Status:   Future    Expiration Date:   09/18/2024      CHIEF COMPLAINT:  CC: MPN  Current Treatment:  Asprin  INTERVAL HISTORY:  Ambria is here today for follow up. She is feeling   PV history 01/30/2022: WBC 5.6 hemoglobin 15.4 platelet count 584; 51 segs 40 lymphs 8 monos 1 EO 1 basophil 7/14/ 2023: JAK2 panel demonstrated JAK2 V617F mutation 01/17/2023: WBC 5.7 hemoglobin 16.9 platelet count 565; 48 segs 7 lymphs 3 monos 02/28/2023:  Hgb 15.5 PLT 540.  To continue therapeutic phlebotomies and undergo bone marrow bx  03/21/23 BONE MARROW, ASPIRATE, CLOT, CORE:  - Mildly hypercellular bone marrow (60%) involved by JAK2 mutated myeloid neoplasm  BONE MARROW ASPIRATE: Cellular  Erythroid precursors: Erythroid precursors show a full sequence of generally orderly maturation  Granulocytic precursors: Granulocytic precursors show full sequence of generally orderly maturation  Megakaryocytes: Increased, with abundant abnormally lobated forms  Lymphocytes/plasma cells: Not increased  Megakaryocytes are increased and show abundant hyper- and hypolobulated forms.     Therapeutics             January 24 2023- Begin  ASA 81 mg daily  Summer [id number] therapeutic phlebotomies to decrease Hgb x 2.   Since last visit. Patient reports feeling tired at times. No night sweats, or weight loss or decreased.  No painful fingers or toes, gout, dizziness, headache. Report history of blurred vision resolved without phlebotomy.  No leg swelling, chest pain, short of breath. No itchiness.  She has heavy menstrual bleeding for first 2 days and are irregular with history fibroid.  Past Medical History:  Diagnosis Date   Chronic back pain    PCL injury    Pre-diabetes    Prediabetes    Past Surgical History:  Procedure Laterality Date   WISDOM TOOTH EXTRACTION      ALLERGIES:  is allergic to guaifenesin & derivatives.  MEDICATIONS:  Current Outpatient Medications  Medication Sig Dispense Refill   acetaminophen (TYLENOL) 500 MG tablet Take 500 mg by mouth every 6 (six) hours as needed.     aspirin EC 81 MG tablet Take 81 mg by mouth daily. Swallow whole.     Cholecalciferol (VITAMIN D3) 1.25 MG (50000 UT) CAPS Take 50,000 Units by mouth once a week.     Multiple Vitamins-Minerals (PX COMPLETE SENIOR MULTIVITS) TABS Take 1 tablet by mouth daily.     Omega-3 1000 MG CAPS Take 1,000 mg by mouth daily.     vitamin B-12 (CYANOCOBALAMIN) 1000 MCG tablet Take 1,000 mcg by mouth daily.     No current facility-administered medications for this visit.    HISTORY OF PRESENT ILLNESS:   Past Medical History:  Diagnosis Date   Chronic back pain    PCL injury    Pre-diabetes    Prediabetes     REVIEW OF SYSTEMS:   All relevant systems were reviewed with the patient and are negative.   VITALS:  Blood pressure 105/83, pulse 85, temperature 98.1 F (36.7 C), temperature source Temporal, resp. rate 16, height 5\' 8"  (1.727 m), weight 192 lb 14.4 oz (87.5 kg), SpO2 99%.  Wt Readings from Last 3 Encounters:  09/19/23 192 lb 14.4 oz (87.5 kg)  03/21/23 190 lb (86.2 kg)  02/28/23 193 lb 8 oz (87.8 kg)    Body mass  index is 29.33 kg/m.  Performance status (ECOG): 0 - Asymptomatic  PHYSICAL EXAM:   GENERAL: alert, no distress and comfortable SKIN: skin color normal EYES: normal, sclera clear OROPHARYNX: no exudate, or lesions  NECK: supple, no palpable mass LYMPH:  no palpable cervical lymphadenopathy LUNGS: clear to auscultation with normal breathing effort.  No wheeze or Rales HEART: regular rate & rhythm and no lower extremity edema ABDOMEN: abdomen soft, non-tender and nondistended NEURO: alert, no focal motor/sensory deficits  LABORATORY DATA:  I have reviewed the data as listed    Component Value Date/Time   NA 139 09/19/2023 1442   K 4.2 09/19/2023 1442   CL 106 09/19/2023 1442   CO2 26 09/19/2023 1442   GLUCOSE 84 09/19/2023 1442   BUN 14 09/19/2023 1442   CREATININE 0.81 09/19/2023 1442   CREATININE 0.84 01/17/2023 1640  CALCIUM 9.3 09/19/2023 1442   PROT 7.7 09/19/2023 1442   ALBUMIN 4.4 09/19/2023 1442   AST 20 09/19/2023 1442   ALT 26 09/19/2023 1442   ALKPHOS 39 09/19/2023 1442   BILITOT 0.7 09/19/2023 1442   GFRNONAA >60 09/19/2023 1442   GFRAA >60 12/04/2015 2249    No results found for: "SPEP", "UPEP"  Lab Results  Component Value Date   WBC 5.3 09/19/2023   NEUTROABS 2.5 09/19/2023   HGB 16.0 (H) 09/19/2023   HCT 48.0 (H) 09/19/2023   MCV 85.4 09/19/2023   PLT 654 (H) 09/19/2023      Chemistry      Component Value Date/Time   NA 139 09/19/2023 1442   K 4.2 09/19/2023 1442   CL 106 09/19/2023 1442   CO2 26 09/19/2023 1442   BUN 14 09/19/2023 1442   CREATININE 0.81 09/19/2023 1442   CREATININE 0.84 01/17/2023 1640      Component Value Date/Time   CALCIUM 9.3 09/19/2023 1442   ALKPHOS 39 09/19/2023 1442   AST 20 09/19/2023 1442   ALT 26 09/19/2023 1442   BILITOT 0.7 09/19/2023 1442       RADIOGRAPHIC STUDIES: I have personally reviewed the radiological images as listed and agreed with the findings in the report. No results found.

## 2023-09-19 ENCOUNTER — Inpatient Hospital Stay: Payer: BC Managed Care – PPO

## 2023-09-19 ENCOUNTER — Inpatient Hospital Stay (HOSPITAL_BASED_OUTPATIENT_CLINIC_OR_DEPARTMENT_OTHER): Payer: BC Managed Care – PPO

## 2023-09-19 ENCOUNTER — Encounter: Payer: Self-pay | Admitting: Oncology

## 2023-09-19 VITALS — BP 105/83 | HR 85 | Temp 98.1°F | Resp 16 | Ht 68.0 in | Wt 192.9 lb

## 2023-09-19 DIAGNOSIS — D471 Chronic myeloproliferative disease: Secondary | ICD-10-CM

## 2023-09-19 DIAGNOSIS — D751 Secondary polycythemia: Secondary | ICD-10-CM | POA: Diagnosis not present

## 2023-09-19 DIAGNOSIS — Z7982 Long term (current) use of aspirin: Secondary | ICD-10-CM | POA: Diagnosis not present

## 2023-09-19 DIAGNOSIS — D45 Polycythemia vera: Secondary | ICD-10-CM | POA: Diagnosis not present

## 2023-09-19 LAB — CBC WITH DIFFERENTIAL (CANCER CENTER ONLY)
Abs Immature Granulocytes: 0.02 10*3/uL (ref 0.00–0.07)
Basophils Absolute: 0 10*3/uL (ref 0.0–0.1)
Basophils Relative: 0 %
Eosinophils Absolute: 0.1 10*3/uL (ref 0.0–0.5)
Eosinophils Relative: 2 %
HCT: 48 % — ABNORMAL HIGH (ref 36.0–46.0)
Hemoglobin: 16 g/dL — ABNORMAL HIGH (ref 12.0–15.0)
Immature Granulocytes: 0 %
Lymphocytes Relative: 42 %
Lymphs Abs: 2.2 10*3/uL (ref 0.7–4.0)
MCH: 28.5 pg (ref 26.0–34.0)
MCHC: 33.3 g/dL (ref 30.0–36.0)
MCV: 85.4 fL (ref 80.0–100.0)
Monocytes Absolute: 0.4 10*3/uL (ref 0.1–1.0)
Monocytes Relative: 8 %
Neutro Abs: 2.5 10*3/uL (ref 1.7–7.7)
Neutrophils Relative %: 48 %
Platelet Count: 654 10*3/uL — ABNORMAL HIGH (ref 150–400)
RBC: 5.62 MIL/uL — ABNORMAL HIGH (ref 3.87–5.11)
RDW: 16.3 % — ABNORMAL HIGH (ref 11.5–15.5)
WBC Count: 5.3 10*3/uL (ref 4.0–10.5)
nRBC: 0 % (ref 0.0–0.2)

## 2023-09-19 LAB — CMP (CANCER CENTER ONLY)
ALT: 26 U/L (ref 0–44)
AST: 20 U/L (ref 15–41)
Albumin: 4.4 g/dL (ref 3.5–5.0)
Alkaline Phosphatase: 39 U/L (ref 38–126)
Anion gap: 7 (ref 5–15)
BUN: 14 mg/dL (ref 6–20)
CO2: 26 mmol/L (ref 22–32)
Calcium: 9.3 mg/dL (ref 8.9–10.3)
Chloride: 106 mmol/L (ref 98–111)
Creatinine: 0.81 mg/dL (ref 0.44–1.00)
GFR, Estimated: >60 mL/min (ref >60)
Glucose, Bld: 84 mg/dL (ref 70–99)
Potassium: 4.2 mmol/L (ref 3.5–5.1)
Sodium: 139 mmol/L (ref 135–145)
Total Bilirubin: 0.7 mg/dL (ref 0.0–1.2)
Total Protein: 7.7 g/dL (ref 6.5–8.1)

## 2023-09-19 LAB — LACTATE DEHYDROGENASE: LDH: 199 U/L — ABNORMAL HIGH (ref 98–192)

## 2023-09-19 NOTE — Assessment & Plan Note (Signed)
 ET Low risk: Manage cardiovascular risk factors Monitor for thrombosis Aspirin 81 mg daily Will repeat labs in about 3 months

## 2023-09-19 NOTE — Assessment & Plan Note (Signed)
Phlebotomy to keep HGB <15 Phlebotomy about every 3-4 months

## 2023-09-21 IMAGING — DX DG LUMBAR SPINE COMPLETE 4+V
4 series · 4 of 4 positions shown · non-contrast
Comparison: None Available.

CLINICAL DATA: Chronic low back pain

EXAM:
LUMBAR SPINE - COMPLETE 4+ VIEW

[lumbar spine ap]
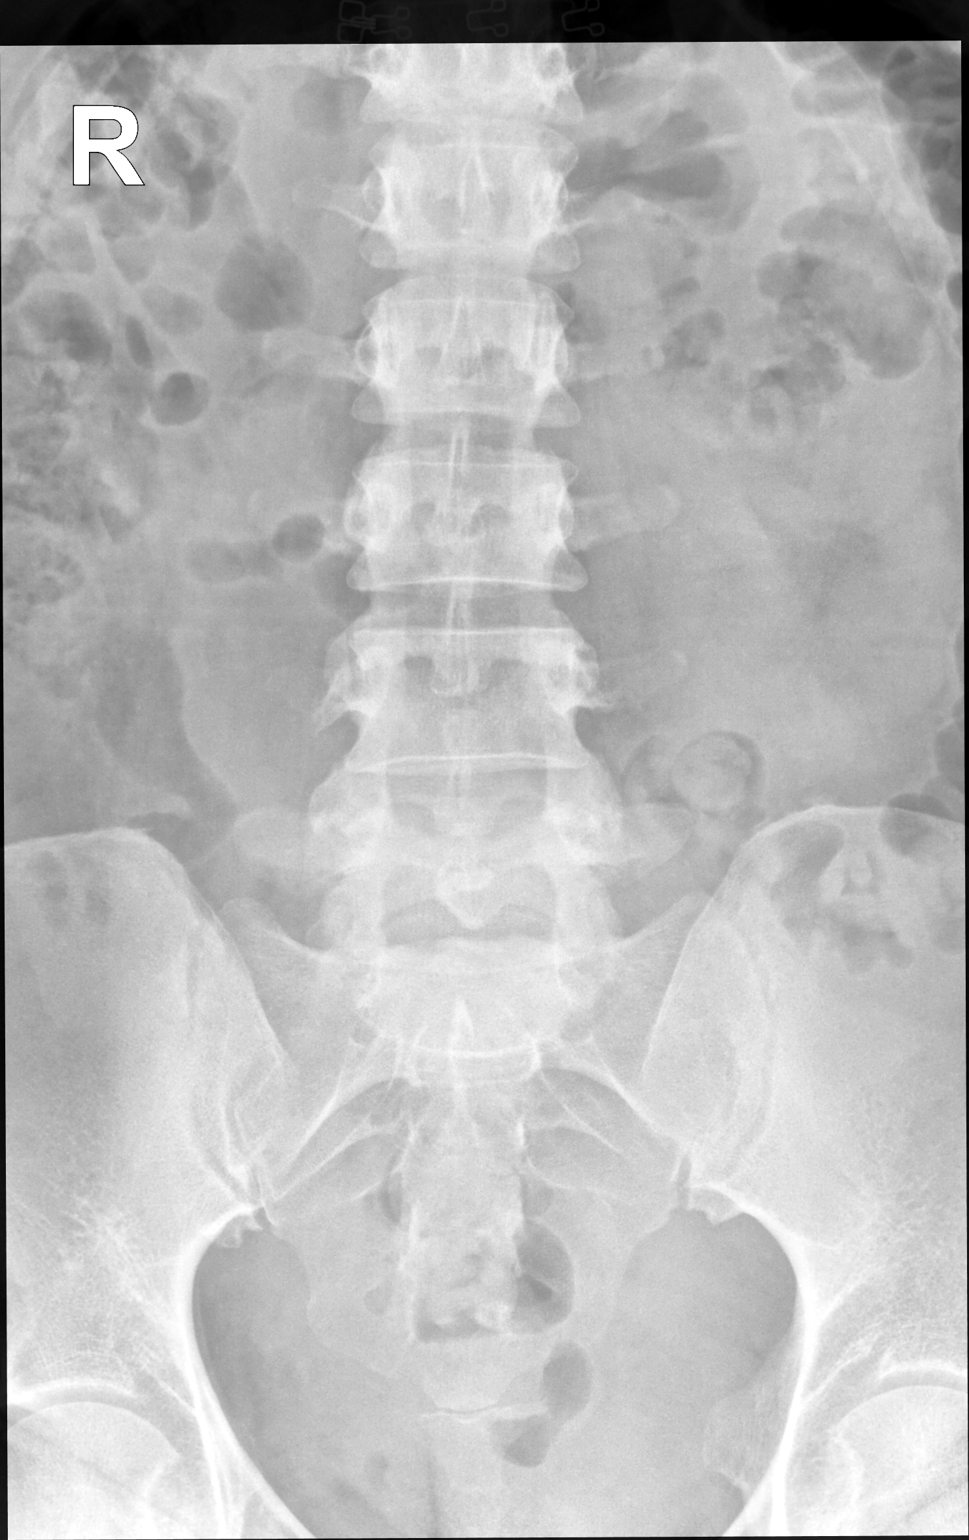

[lumbar spine lmo (1 of 2)]
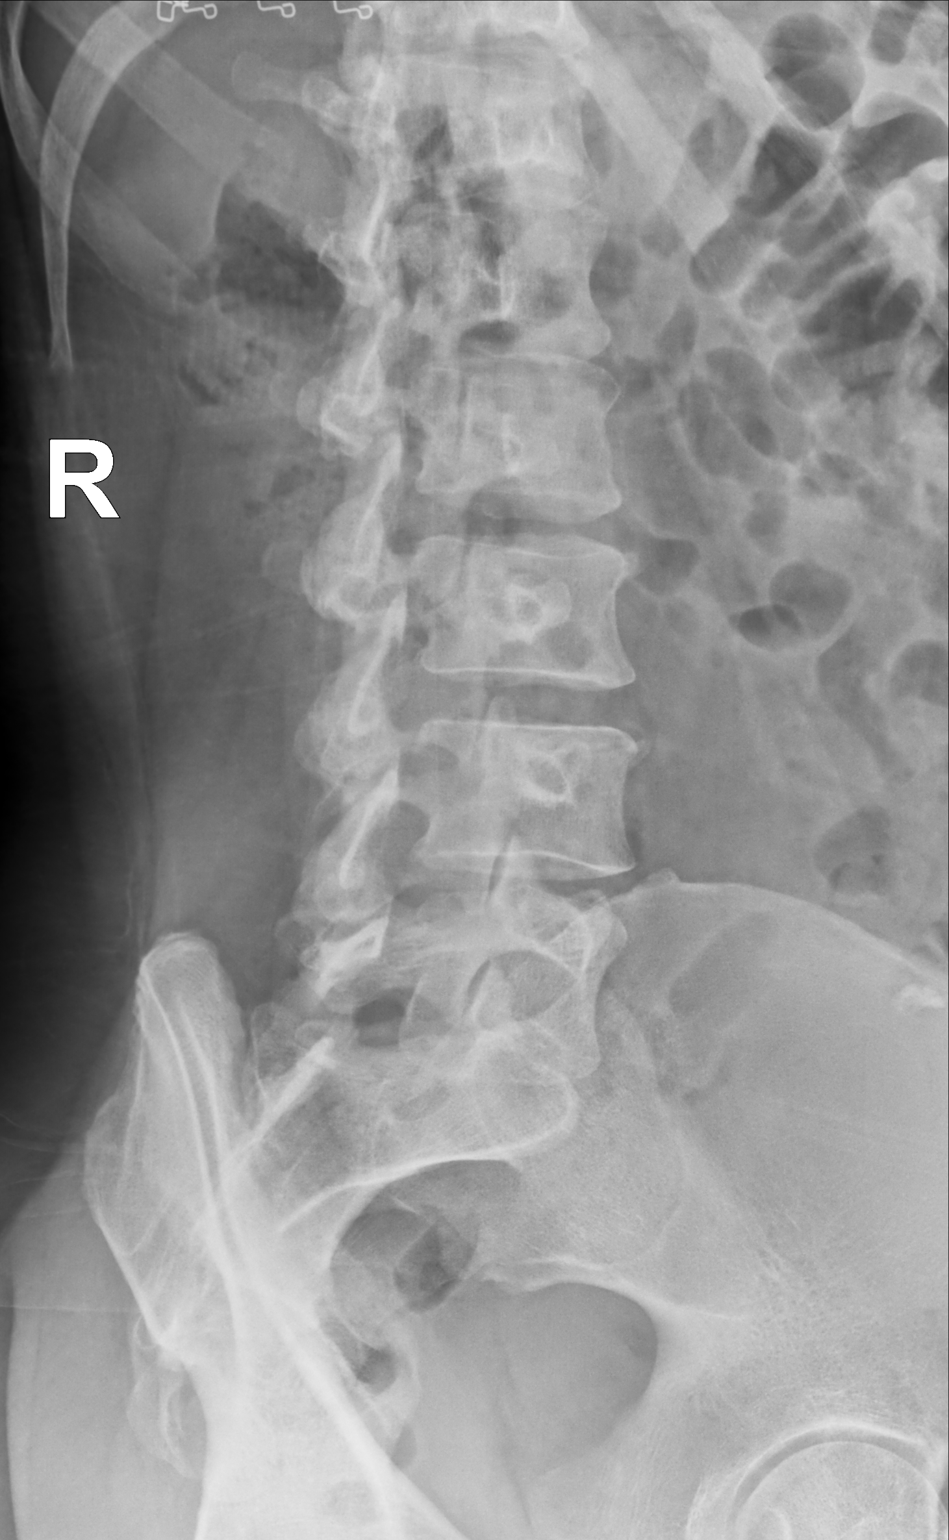

[lumbar spine lmo (2 of 2)]
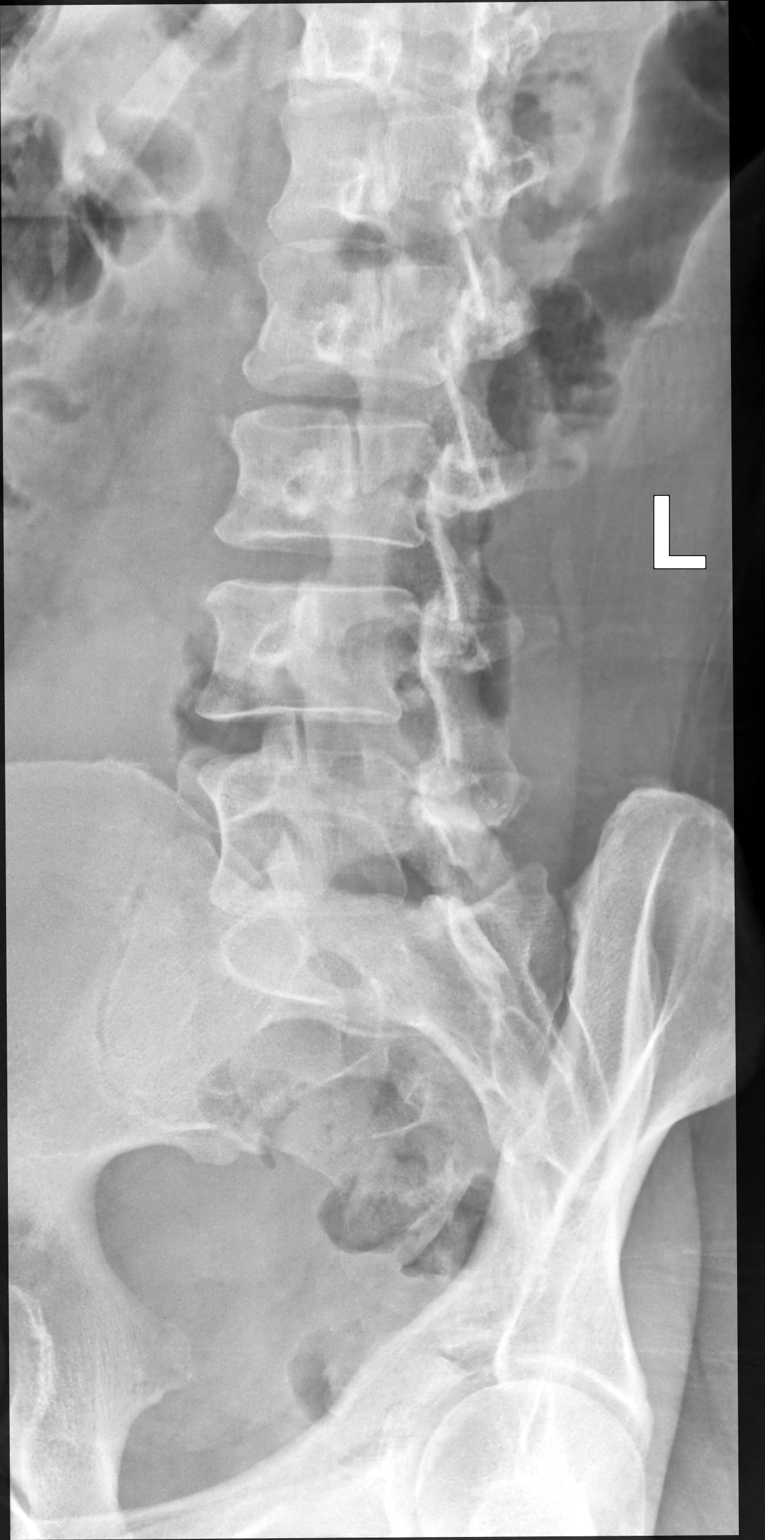

[lumbar spine lat]
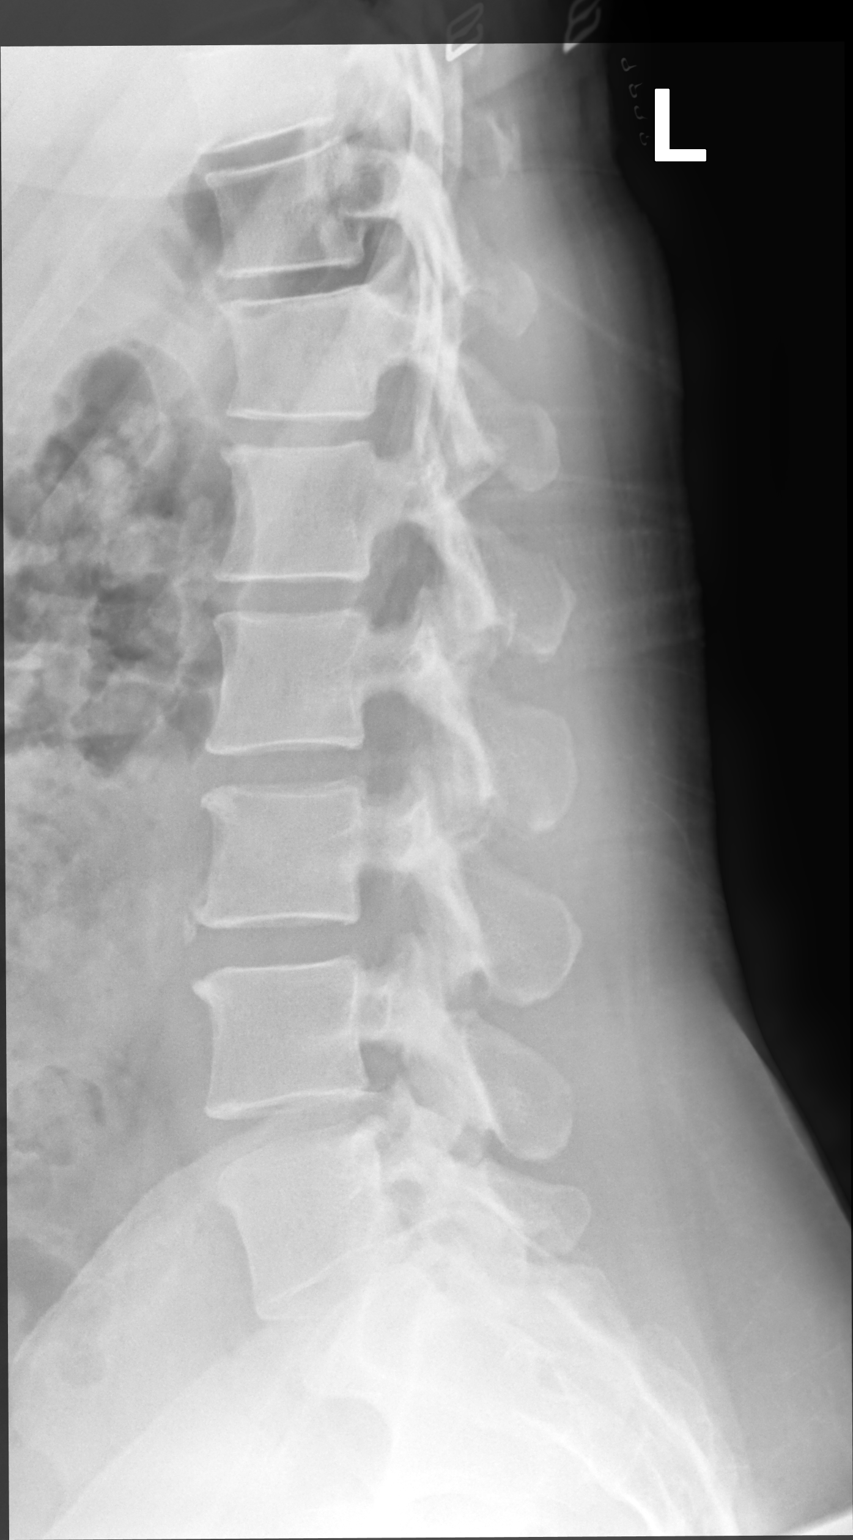

[4 of 4 positions shown; findings below may reference images not displayed]

FINDINGS: No fracture or spondylolisthesis identified. No spondylolysis. Mild
intervertebral disc space narrowing and facet arthropathy at L4-L5
and L5-S1.
IMPRESSION: Mild degenerative changes.

## 2023-09-22 NOTE — Addendum Note (Signed)
Addended by: Geanie Berlin on: 09/22/2023 08:41 AM   Modules accepted: Orders

## 2023-09-24 ENCOUNTER — Other Ambulatory Visit: Payer: Self-pay

## 2023-09-24 ENCOUNTER — Inpatient Hospital Stay: Payer: BC Managed Care – PPO

## 2023-09-24 VITALS — BP 119/85 | HR 89 | Temp 98.4°F | Resp 19

## 2023-09-24 DIAGNOSIS — D471 Chronic myeloproliferative disease: Secondary | ICD-10-CM

## 2023-09-24 DIAGNOSIS — D751 Secondary polycythemia: Secondary | ICD-10-CM | POA: Insufficient documentation

## 2023-09-24 NOTE — Progress Notes (Signed)
 Pt presented today for phlebotomy. 18G to L AC started at 1600, 5cc blood obtained, pt clotted off. Attempted to flush IV, able to pull back blood return but unable to drain to phlebotomy bag. 16G phlebotomy kit to L AC started at 1610, 170cc blood obtained, pt clotted again. Notified Dr Tina of ability to obtain 175cc of blood and pt clotted twice, pt will be scheduled for next Wednesday at 4pm. Educated pt on importance of staying hydrated. VSS, ambulatory to lobby upon discharge.

## 2023-10-01 ENCOUNTER — Inpatient Hospital Stay: Payer: BC Managed Care – PPO

## 2023-10-01 VITALS — BP 118/88 | HR 85 | Temp 98.7°F | Resp 20

## 2023-10-01 DIAGNOSIS — D751 Secondary polycythemia: Secondary | ICD-10-CM | POA: Diagnosis not present

## 2023-10-01 DIAGNOSIS — D471 Chronic myeloproliferative disease: Secondary | ICD-10-CM

## 2023-10-01 NOTE — Progress Notes (Signed)
Linda Ross presents today for phlebotomy per MD orders. Phlebotomy procedure started via 16g phlebotomy kit in LAC at 1619 and ended at 1648.  532 grams removed.  IV needle removed intact.  Snack and beverage offered to patient and accepted.  Patient observed for 15 minutes after procedure without any incident. Patient tolerated procedure well.  VSS.  No s/s or c/o distress or discomfort.

## 2023-10-01 NOTE — Patient Instructions (Signed)

## 2024-01-14 NOTE — Progress Notes (Unsigned)
 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 (Megakaryocytes are increased and show abundant hyper- and hypolobulated forms.)   ET risk:  Low risk. Age <=60 years, with JAK2 mutation, no prior history of thrombosis.   Phlebotomy after last visit. Tolerated.  Will hold phlebotomy. Still has active menstrual bleeding monthly. Repeat labs in 3 months.  Signs of erythromelalgia. Will increase aspirin to 81 mg twice daily and see if that helps. Assessment & Plan MPN (myeloproliferative neoplasm) (HCC) ET Low risk: Manage cardiovascular risk factors Monitor for thrombosis Take a aspirin 81 mg in the morning and at night after food. If not helping with finger sensation in a month, go back to take one a day.  Phlebotomy  Will repeat labs in about 3 months Erythromelalgia (HCC) aspirin 81 mg in the morning and at night after food. If not helping with finger sensation in a month, go back to take one a day.  Microcytosis Will check iron studies and ferritin Will take oral iron on the week of menstrual bleeding.  Orders Placed This Encounter  Procedures   CBC with Differential (Cancer Center Only)    Standing Status:   Future    Expiration Date:   01/15/2025   CMP (Cancer Center only)    Standing Status:   Future    Expiration Date:   01/15/2025   Iron and Iron Binding Capacity ([facility] only)    Standing Status:   Future    Expiration Date:   01/15/2025   Lactate dehydrogenase    Standing Status:   Future    Expiration Date:   01/15/2025   Ferritin    Standing Status:   Future    Expiration Date:   01/15/2025     Linda Ruddy, MD  INTERVAL HISTORY: Patient returns for follow-up. She has headache about once weekly. No difference after  last phlebotomy. She drinks about 60 oz per day. No trouble urinating.  Report chest pain, and ECG was normal yesterday.  No palpitation.   No finger and toes redness or swelling. Itchy sometimes.  Appetite is good. No night sweats.  Oncology History  MPN (myeloproliferative neoplasm) (HCC)  02/2022 Initial Diagnosis   MPN  01/30/2022: WBC 5.6 hemoglobin 15.4 platelet count 584; 51 segs 40 lymphs 8 monos 1 EO 1 basophil 7/14/ 2023: JAK2 panel demonstrated JAK2 V617F mutation 01/17/2023: WBC 5.7 hemoglobin 16.9 platelet count 565; 48 segs 7 lymphs 3 monos 02/28/2023:  Hgb 15.5 PLT 540.  To continue therapeutic phlebotomies and undergo bone marrow bx   03/21/23 BONE MARROW, ASPIRATE, CLOT, CORE:  - Mildly hypercellular bone marrow (60%) involved by JAK2 mutated myeloid neoplasm  BONE MARROW ASPIRATE: Cellular  Erythroid precursors: Erythroid precursors show a full sequence of generally orderly maturation  Granulocytic precursors: Granulocytic precursors show full sequence of generally orderly maturation  Megakaryocytes: Increased, with abundant abnormally lobated forms  Lymphocytes/plasma cells: Not increased  Megakaryocytes are increased and show abundant hyper- and hypolobulated forms.     Therapeutics             January 24 2023- Begin ASA 81 mg daily  Summer 96045 therapeutic phlebotomies to decrease Hgb x 2.    Since last visit. Patient reports feeling tired at times. No night sweats, or weight loss  or decreased.  No painful fingers or toes, gout, dizziness, headache. Report history of blurred vision resolved without phlebotomy.  No leg swelling, chest pain, short of breath. No itchiness.    Past Medical History:  Diagnosis Date   Chronic back pain    PCL injury    Pre-diabetes    Prediabetes      PHYSICAL EXAMINATION: ECOG PERFORMANCE STATUS: 1 - Symptomatic but completely ambulatory  Vitals:   01/16/24 1519  BP: 123/88  Pulse: 88  Resp: 14  Temp: (!) 97.3 F (36.3  C)  SpO2: 97%   Filed Weights   01/16/24 1519  Weight: 194 lb 9.6 oz (88.3 kg)    GENERAL: alert, no distress and comfortable SKIN: slightly redness over fingers; no bruising or petechiae on exposed skin EYES: normal, sclera clear LUNGS: clear to auscultation and percussion with normal breathing effort HEART: regular rate & rhythm  ABDOMEN: abdomen soft, non-tender and nondistended. Musculoskeletal: no edema   Relevant data reviewed during this visit included labs

## 2024-01-14 NOTE — Assessment & Plan Note (Addendum)
 ET Low risk: Manage cardiovascular risk factors Monitor for thrombosis Take a aspirin 81 mg in the morning and at night after food. If not helping with finger sensation in a month, go back to take one a day.  Phlebotomy  Will repeat labs in about 3 months

## 2024-01-15 ENCOUNTER — Ambulatory Visit (INDEPENDENT_AMBULATORY_CARE_PROVIDER_SITE_OTHER): Admitting: Nurse Practitioner

## 2024-01-15 ENCOUNTER — Ambulatory Visit: Payer: Self-pay | Admitting: Nurse Practitioner

## 2024-01-15 ENCOUNTER — Encounter: Payer: Self-pay | Admitting: Nurse Practitioner

## 2024-01-15 VITALS — BP 102/64 | HR 79 | Temp 97.4°F | Ht 68.0 in | Wt 194.6 lb

## 2024-01-15 DIAGNOSIS — G43709 Chronic migraine without aura, not intractable, without status migrainosus: Secondary | ICD-10-CM

## 2024-01-15 DIAGNOSIS — Z1211 Encounter for screening for malignant neoplasm of colon: Secondary | ICD-10-CM

## 2024-01-15 DIAGNOSIS — R079 Chest pain, unspecified: Secondary | ICD-10-CM | POA: Diagnosis not present

## 2024-01-15 DIAGNOSIS — M79661 Pain in right lower leg: Secondary | ICD-10-CM

## 2024-01-15 DIAGNOSIS — Z0000 Encounter for general adult medical examination without abnormal findings: Secondary | ICD-10-CM | POA: Diagnosis not present

## 2024-01-15 DIAGNOSIS — D471 Chronic myeloproliferative disease: Secondary | ICD-10-CM | POA: Diagnosis not present

## 2024-01-15 LAB — LIPID PANEL
Cholesterol: 115 mg/dL (ref 0–200)
HDL: 48.7 mg/dL (ref >39.00)
LDL Cholesterol: 57 mg/dL (ref 0–99)
NonHDL: 65.94
Total CHOL/HDL Ratio: 2
Triglycerides: 47 mg/dL (ref 0.0–149.0)
VLDL: 9.4 mg/dL (ref 0.0–40.0)

## 2024-01-15 LAB — TSH: TSH: 2.08 u[IU]/mL (ref 0.35–5.50)

## 2024-01-15 LAB — VITAMIN D 25 HYDROXY (VIT D DEFICIENCY, FRACTURES): VITD: 35.53 ng/mL (ref 30.00–100.00)

## 2024-01-15 NOTE — Assessment & Plan Note (Signed)
 She is currently following with hematology and has had a couple of therapeutic phlebotomy. She has labs scheduled for tomorrow and an appointment as well. Encouraged her to keep this appointment.

## 2024-01-15 NOTE — Patient Instructions (Signed)
 It was great to see you!  We are checking your labs today and will let you know the results via mychart/phone.   Keep your appointment with hematology tomorrow  Let's follow-up in 1 year, sooner if you have concerns.  If a referral was placed today, you will be contacted for an appointment. Please note that routine referrals can sometimes take up to 3-4 weeks to process. Please call our office if you haven't heard anything after this time frame.  Take care,  Rheba Cedar, NP

## 2024-01-15 NOTE — Progress Notes (Signed)
 EKG interpreted by me on 01/15/24 showed normal sinus rhythm with a heart rate of 70, no ST or T wave changes.

## 2024-01-15 NOTE — Assessment & Plan Note (Signed)
 EKG today showed normal sinus rhythm with no ST or T wave changes. She is currently taking 81mg  aspirin daily. She had a stress test in 2021 which was normal. Check CMP, CBC, TSH today. She is interested in further cardiac testing, will order Calcium Score CT.

## 2024-01-15 NOTE — Assessment & Plan Note (Signed)
Chronic, stable. She has not had any headaches recently.

## 2024-01-15 NOTE — Progress Notes (Signed)
 BP 102/64 (BP Location: Left Arm, Patient Position: Sitting, Cuff Size: Normal)   Pulse 79   Temp (!) 97.4 F (36.3 C)   Ht 5\' 8"  (1.727 m)   Wt 194 lb 9.6 oz (88.3 kg)   LMP 01/01/2024   SpO2 97%   BMI 29.59 kg/m    Subjective:    Patient ID: Linda Ross, female    DOB: 11-Apr-1976, 48 y.o.   MRN: [id number]  CC: Chief Complaint  Patient presents with   Annual Exam    With fasting lab work, no concerns    HPI: Linda Ross is a 48 y.o. female presenting on 01/15/2024 for comprehensive medical examination. Current medical complaints include:chest pain  She has been experiencing intermittent chest pain that lasts for a few minutes at a time. She does not have any associated shortness of breath, sweating, or dizziness. 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 She also notes that she has been having intermittent right calf pain. She denies swelling and warmness.   She currently lives with: husband  Menopausal Symptoms: no  Depression and Anxiety Screen done today and results listed below:     01/15/2024    9:28 AM 09/27/2022   11:09 AM 01/10/2022    4:52 PM  Depression screen PHQ 2/9  Decreased Interest 0 0 0  Down, Depressed, Hopeless 0 0 0  PHQ - 2 Score 0 0 0  Altered sleeping 0  0  Tired, decreased energy 0  2  Change in appetite 0  0  Feeling bad or failure about yourself  0  0  Trouble concentrating 0  0  Moving slowly or fidgety/restless 0  0  Suicidal thoughts 0  0  PHQ-9 Score 0  2  Difficult doing work/chores Not difficult at all        01/15/2024    9:28 AM 01/10/2022    4:52 PM  GAD 7 : Generalized Anxiety Score  Nervous, Anxious, on Edge 0 0  Control/stop worrying 0 0  Worry too much - different things 0 0  Trouble relaxing 0 0  Restless 0 0  Easily annoyed or irritable 0 0  Afraid - awful might happen 0 0  Total GAD 7 Score 0 0  Anxiety Difficulty Not  difficult at all Not difficult at all    The patient does not have a history of falls. I did not complete a risk assessment for falls. A plan of care for falls was not documented.   Past Medical History:  Past Medical History:  Diagnosis Date   Chronic back pain    PCL injury    Pre-diabetes    Prediabetes     Surgical History:  Past Surgical History:  Procedure Laterality Date   WISDOM TOOTH EXTRACTION      Medications:  Current Outpatient Medications on File Prior to Visit  Medication Sig   acetaminophen (TYLENOL) 500 MG tablet Take 500 mg by mouth every 6 (six) hours as needed.   aspirin EC 81 MG tablet Take 81 mg by mouth daily. Swallow whole.   Cholecalciferol (VITAMIN D3) 1.25 MG (50000 UT) CAPS Take 50,000 Units by mouth once a week.   Multiple Vitamins-Minerals (PX COMPLETE SENIOR MULTIVITS) TABS Take 1 tablet by mouth daily.   Omega-3 1000 MG CAPS Take 1,000 mg by mouth daily.   vitamin B-12 (CYANOCOBALAMIN ) 1000 MCG tablet Take 1,000 mcg by mouth daily.  No current facility-administered medications on file prior to visit.    Allergies:  Allergies  Allergen Reactions   Guaifenesin & Derivatives Other (See Comments)    headache    Social History:  Social History   Socioeconomic History   Marital status: Married    Spouse name: Not on file   Number of children: Not on file   Years of education: Not on file   Highest education level: Master's degree (e.g., MA, MS, MEng, MEd, MSW, MBA)  Occupational History   Not on file  Tobacco Use   Smoking status: Never   Smokeless tobacco: Never  Vaping Use   Vaping status: Never Used  Substance and Sexual Activity   Alcohol use: No   Drug use: No   Sexual activity: Yes    Birth control/protection: None  Other Topics Concern   Not on file  Social History Narrative   Not on file   Social Drivers of Health   Financial Resource Strain: Low Risk  (01/14/2024)   Overall Financial Resource Strain (CARDIA)     Difficulty of Paying Living Expenses: Not very hard  Food Insecurity: No Food Insecurity (01/14/2024)   Hunger Vital Sign    Worried About Running Out of Food in the Last Year: Never true    Ran Out of Food in the Last Year: Never true  Transportation Needs: No Transportation Needs (01/14/2024)   PRAPARE - Administrator, Civil Service (Medical): No    Lack of Transportation (Non-Medical): No  Physical Activity: Insufficiently Active (01/14/2024)   Exercise Vital Sign    Days of Exercise per Week: 3 days    Minutes of Exercise per Session: 30 min  Stress: No Stress Concern Present (01/14/2024)   [employer] of Occupational Health - Occupational Stress Questionnaire    Feeling of Stress : Not at all  Social Connections: Socially Integrated (01/14/2024)   Social Connection and Isolation Panel [NHANES]    Frequency of Communication with Friends and Family: More than three times a week    Frequency of Social Gatherings with Friends and Family: Once a week    Attends Religious Services: More than 4 times per year    Active Member of [employer] or Organizations: Yes    Attends Engineer, structural: More than 4 times per year    Marital Status: Married  Catering manager Violence: Not on file   Social History   Tobacco Use  Smoking Status Never  Smokeless Tobacco Never   Social History   Substance and Sexual Activity  Alcohol Use No    Family History:  Family History  Problem Relation Age of Onset   Hypertension Mother    Cancer Father        prostate   Hypertension Father    Heart failure Father    Diabetes Father    Kidney failure Father     Past medical history, surgical history, medications, allergies, family history and social history reviewed with patient today and changes made to appropriate areas of the chart.   Review of Systems  Constitutional: Negative.   HENT: Negative.    Eyes: Negative.   Respiratory: Negative.    Cardiovascular:   Positive for chest pain (intermittent). Negative for leg swelling.  Gastrointestinal: Negative.   Genitourinary: Negative.   Musculoskeletal:  Positive for myalgias (intermittent right calf pain).  Skin: Negative.   Neurological: Negative.   Psychiatric/Behavioral: Negative.     All other ROS negative except what is  listed above and in the HPI.      Objective:     BP 102/64 (BP Location: Left Arm, Patient Position: Sitting, Cuff Size: Normal)   Pulse 79   Temp (!) 97.4 F (36.3 C)   Ht 5\' 8"  (1.727 m)   Wt 194 lb 9.6 oz (88.3 kg)   LMP 01/01/2024   SpO2 97%   BMI 29.59 kg/m   Wt Readings from Last 3 Encounters:  01/15/24 194 lb 9.6 oz (88.3 kg)  09/19/23 192 lb 14.4 oz (87.5 kg)  03/21/23 190 lb (86.2 kg)    Physical Exam Vitals and nursing note reviewed.  Constitutional:      General: She is not in acute distress.    Appearance: Normal appearance.  HENT:     Head: Normocephalic and atraumatic.     Right Ear: Tympanic membrane, ear canal and external ear normal.     Left Ear: Tympanic membrane, ear canal and external ear normal.     Mouth/Throat:     Mouth: Mucous membranes are moist.     Pharynx: No posterior oropharyngeal erythema.  Eyes:     Conjunctiva/sclera: Conjunctivae normal.  Cardiovascular:     Rate and Rhythm: Normal rate and regular rhythm.     Pulses: Normal pulses.     Heart sounds: Normal heart sounds.  Pulmonary:     Effort: Pulmonary effort is normal.     Breath sounds: Normal breath sounds.  Abdominal:     Palpations: Abdomen is soft.     Tenderness: There is no abdominal tenderness.  Musculoskeletal:        General: Normal range of motion.     Cervical back: Normal range of motion and neck supple.     Right lower leg: No edema.     Left lower leg: No edema.  Lymphadenopathy:     Cervical: No cervical adenopathy.  Skin:    General: Skin is warm and dry.  Neurological:     General: No focal deficit present.     Mental Status: She is  alert and oriented to person, place, and time.     Cranial Nerves: No cranial nerve deficit.     Coordination: Coordination normal.     Gait: Gait normal.  Psychiatric:        Mood and Affect: Mood normal.        Behavior: Behavior normal.        Thought Content: Thought content normal.        Judgment: Judgment normal.     Results for orders placed or performed in visit on 01/15/24  Lipid panel   Collection Time: 01/15/24 10:06 AM  Result Value Ref Range   Cholesterol 115 0 - 200 mg/dL   Triglycerides 29.5 0.0 - 149.0 mg/dL   HDL 62.13 >08.65 mg/dL   VLDL 9.4 0.0 - 78.4 mg/dL   LDL Cholesterol 57 0 - 99 mg/dL   Total CHOL/HDL Ratio 2    NonHDL 65.94   VITAMIN D  25 Hydroxy (Vit-D Deficiency, Fractures)   Collection Time: 01/15/24 10:06 AM  Result Value Ref Range   VITD 35.53 30.00 - 100.00 ng/mL  TSH   Collection Time: 01/15/24 10:06 AM  Result Value Ref Range   TSH 2.08 0.35 - 5.50 uIU/mL      Assessment & Plan:   Problem List Items Addressed This Visit       Cardiovascular and Mediastinum   Chronic migraine without aura without status migrainosus, not intractable  Chronic, stable. She has not had any headaches recently.       Relevant Orders   VITAMIN D  25 Hydroxy (Vit-D Deficiency, Fractures) (Completed)     Other   Routine general medical examination at a health care facility - Primary   Health maintenance reviewed and updated. Discussed nutrition, exercise. Follow-up 1 year.        MPN (myeloproliferative neoplasm) (HCC)   She is currently following with hematology and has had a couple of therapeutic phlebotomy. She has labs scheduled for tomorrow and an appointment as well. Encouraged her to keep this appointment.       Relevant Orders   VITAMIN D  25 Hydroxy (Vit-D Deficiency, Fractures) (Completed)   Chest pain   EKG today showed normal sinus rhythm with no ST or T wave changes. She is currently taking 81mg  aspirin daily. She had a stress test in  2021 which was normal. Check CMP, CBC, TSH today. She is interested in further cardiac testing, will order Calcium Score CT.       Relevant Orders   Lipid panel (Completed)   VITAMIN D  25 Hydroxy (Vit-D Deficiency, Fractures) (Completed)   TSH (Completed)   EKG 12-Lead (Completed)   CT CARDIAC SCORING (SELF PAY ONLY)   Other Visit Diagnoses       Screen for colon cancer       Cologuard ordered today.   Relevant Orders   Cologuard     Right calf pain       Intermittent right calf pain, no swelling. With history of polycythemia vera, will check d-dimer today.   Relevant Orders   VITAMIN D  25 Hydroxy (Vit-D Deficiency, Fractures) (Completed)   D-Dimer, Quantitative        Follow up plan: Return in about 1 year (around 01/14/2025) for CPE.   LABORATORY TESTING:  - Pap smear: up to date  IMMUNIZATIONS:   - Tdap: Tetanus vaccination status reviewed: last tetanus booster within 10 years. - Influenza: Postponed to flu season - Pneumovax: Not applicable - Prevnar: Not applicable - HPV: Not applicable - Shingrix vaccine: Not applicable  SCREENING: -Mammogram: Up to date  - Colonoscopy: Ordered today  - Bone Density: Not applicable   PATIENT COUNSELING:   Advised to take 1 mg of folate supplement per day if capable of pregnancy.   Sexuality: Discussed sexually transmitted diseases, partner selection, use of condoms, avoidance of unintended pregnancy  and contraceptive alternatives.   Advised to avoid cigarette smoking.  I discussed with the patient that most people either abstain from alcohol or drink within safe limits (<=14/week and <=4 drinks/occasion for males, <=7/weeks and <= 3 drinks/occasion for females) and that the risk for alcohol disorders and other health effects rises proportionally with the number of drinks per week and how often a drinker exceeds daily limits.  Discussed cessation/primary prevention of drug use and availability of treatment for abuse.    Diet: Encouraged to adjust caloric intake to maintain  or achieve ideal body weight, to reduce intake of dietary saturated fat and total fat, to limit sodium intake by avoiding high sodium foods and not adding table salt, and to maintain adequate dietary potassium and calcium preferably from fresh fruits, vegetables, and low-fat dairy products.    stressed the importance of regular exercise  Injury prevention: Discussed safety belts, safety helmets, smoke detector, smoking near bedding or upholstery.   Dental health: Discussed importance of regular tooth brushing, flossing, and dental visits.    NEXT PREVENTATIVE PHYSICAL DUE IN  1 YEAR. Return in about 1 year (around 01/14/2025) for CPE.  Ellyse Rotolo A Jolana Runkles

## 2024-01-15 NOTE — Assessment & Plan Note (Signed)
Health maintenance reviewed and updated. Discussed nutrition, exercise. Follow-up 1 year.

## 2024-01-16 ENCOUNTER — Inpatient Hospital Stay: Payer: BC Managed Care – PPO

## 2024-01-16 ENCOUNTER — Other Ambulatory Visit: Payer: Self-pay | Admitting: *Deleted

## 2024-01-16 VITALS — BP 123/88 | HR 88 | Temp 97.3°F | Resp 14 | Wt 194.6 lb

## 2024-01-16 DIAGNOSIS — R718 Other abnormality of red blood cells: Secondary | ICD-10-CM

## 2024-01-16 DIAGNOSIS — I7381 Erythromelalgia: Secondary | ICD-10-CM | POA: Insufficient documentation

## 2024-01-16 DIAGNOSIS — D471 Chronic myeloproliferative disease: Secondary | ICD-10-CM

## 2024-01-16 DIAGNOSIS — Z7982 Long term (current) use of aspirin: Secondary | ICD-10-CM | POA: Insufficient documentation

## 2024-01-16 DIAGNOSIS — D75839 Thrombocytosis, unspecified: Secondary | ICD-10-CM | POA: Insufficient documentation

## 2024-01-16 DIAGNOSIS — D45 Polycythemia vera: Secondary | ICD-10-CM

## 2024-01-16 LAB — CBC WITH DIFFERENTIAL (CANCER CENTER ONLY)
Abs Immature Granulocytes: 0.01 10*3/uL (ref 0.00–0.07)
Basophils Absolute: 0 10*3/uL (ref 0.0–0.1)
Basophils Relative: 0 %
Eosinophils Absolute: 0.2 10*3/uL (ref 0.0–0.5)
Eosinophils Relative: 3 %
HCT: 43.2 % (ref 36.0–46.0)
Hemoglobin: 14.5 g/dL (ref 12.0–15.0)
Immature Granulocytes: 0 %
Lymphocytes Relative: 39 %
Lymphs Abs: 2.2 10*3/uL (ref 0.7–4.0)
MCH: 26.4 pg (ref 26.0–34.0)
MCHC: 33.6 g/dL (ref 30.0–36.0)
MCV: 78.5 fL — ABNORMAL LOW (ref 80.0–100.0)
Monocytes Absolute: 0.4 10*3/uL (ref 0.1–1.0)
Monocytes Relative: 8 %
Neutro Abs: 2.9 10*3/uL (ref 1.7–7.7)
Neutrophils Relative %: 50 %
Platelet Count: 693 10*3/uL — ABNORMAL HIGH (ref 150–400)
RBC: 5.5 MIL/uL — ABNORMAL HIGH (ref 3.87–5.11)
RDW: 15.6 % — ABNORMAL HIGH (ref 11.5–15.5)
WBC Count: 5.6 10*3/uL (ref 4.0–10.5)
nRBC: 0 % (ref 0.0–0.2)

## 2024-01-16 LAB — CMP (CANCER CENTER ONLY)
ALT: 15 U/L (ref 0–44)
AST: 17 U/L (ref 15–41)
Albumin: 4.3 g/dL (ref 3.5–5.0)
Alkaline Phosphatase: 37 U/L — ABNORMAL LOW (ref 38–126)
Anion gap: 6 (ref 5–15)
BUN: 13 mg/dL (ref 6–20)
CO2: 27 mmol/L (ref 22–32)
Calcium: 9.4 mg/dL (ref 8.9–10.3)
Chloride: 104 mmol/L (ref 98–111)
Creatinine: 0.8 mg/dL (ref 0.44–1.00)
GFR, Estimated: >60 mL/min (ref >60)
Glucose, Bld: 84 mg/dL (ref 70–99)
Potassium: 4 mmol/L (ref 3.5–5.1)
Sodium: 137 mmol/L (ref 135–145)
Total Bilirubin: 0.7 mg/dL (ref 0.0–1.2)
Total Protein: 7.6 g/dL (ref 6.5–8.1)

## 2024-01-16 LAB — IRON AND IRON BINDING CAPACITY (CC-WL,HP ONLY)
Iron: 62 ug/dL (ref 28–170)
Saturation Ratios: 14 % (ref 10.4–31.8)
TIBC: 447 ug/dL (ref 250–450)
UIBC: 385 ug/dL (ref 148–442)

## 2024-01-16 LAB — D-DIMER, QUANTITATIVE: D-Dimer, Quant: <0.19 ug{FEU}/mL (ref <0.50)

## 2024-01-16 LAB — FERRITIN: Ferritin: 10 ng/mL — ABNORMAL LOW (ref 11–307)

## 2024-01-16 LAB — LACTATE DEHYDROGENASE: LDH: 239 U/L — ABNORMAL HIGH (ref 98–192)

## 2024-01-16 NOTE — Patient Instructions (Signed)
 Take one iron on the week of menstrual bleeding. Drink 60-70 oz of fluid daily. Take a baby aspirin 81 mg in the morning and at night after food. If not helping with finger sensation in a month, go back to take one a day.

## 2024-01-16 NOTE — Assessment & Plan Note (Addendum)
 Will check iron studies and ferritin

## 2024-01-16 NOTE — Assessment & Plan Note (Addendum)
 aspirin 81 mg in the morning and at night after food. If not helping with finger sensation in a month, go back to take one a day.

## 2024-01-20 ENCOUNTER — Ambulatory Visit: Payer: Self-pay

## 2024-01-20 NOTE — Telephone Encounter (Signed)
-----   Message from [name] sent at 01/20/2024  9:59 AM EDT ----- Please let her know the ferritin confirms iron deficiency. Please take oral iron daily during the week of her active periods as we discussed in the visit. Thanks.

## 2024-01-22 NOTE — Telephone Encounter (Signed)
-----   Message from [name] sent at 01/20/2024  9:59 AM EDT ----- Please let her know the ferritin confirms iron deficiency. Please take oral iron daily during the week of her active periods as we discussed in the visit. Thanks.

## 2024-01-22 NOTE — Telephone Encounter (Signed)
 LM with note below

## 2024-01-24 DIAGNOSIS — Z1211 Encounter for screening for malignant neoplasm of colon: Secondary | ICD-10-CM | POA: Diagnosis not present

## 2024-01-29 LAB — COLOGUARD: COLOGUARD: NEGATIVE

## 2024-04-15 NOTE — Progress Notes (Unsigned)
 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 (Megakaryocytes are increased and show abundant hyper- and hypolobulated forms.)   ET risk:  Low risk. Age <=60 years, with JAK2 mutation, no prior history of thrombosis. Assessment & Plan MPN (myeloproliferative neoplasm) (HCC) CBC, Cmp LDH in 3 months. Aspirin 81 mg twice daily Return in 3 months.  No orders of the defined types were placed in this encounter.    Linda JAYSON Chihuahua, MD  INTERVAL HISTORY: Patient returns for follow-up.  Oncology History  MPN (myeloproliferative neoplasm) (HCC)  02/2022 Initial Diagnosis   MPN  01/30/2022: WBC 5.6 hemoglobin 15.4 platelet count 584; 51 segs 40 lymphs 8 monos 1 EO 1 basophil 7/14/ 2023: JAK2 panel demonstrated JAK2 V617F mutation 01/17/2023: WBC 5.7 hemoglobin 16.9 platelet count 565; 48 segs 7 lymphs 3 monos 02/28/2023:  Hgb 15.5 PLT 540.  To continue therapeutic phlebotomies and undergo bone marrow bx   03/21/23 BONE MARROW, ASPIRATE, CLOT, CORE:  - Mildly hypercellular bone marrow (60%) involved by JAK2 mutated myeloid neoplasm  BONE MARROW ASPIRATE: Cellular  Erythroid precursors: Erythroid precursors show a full sequence of generally orderly maturation  Granulocytic precursors: Granulocytic precursors show full sequence of generally orderly maturation  Megakaryocytes: Increased, with abundant abnormally lobated forms  Lymphocytes/plasma cells: Not increased  Megakaryocytes are increased and show abundant hyper- and hypolobulated forms.     Therapeutics             January 24 2023- Begin ASA 81 mg daily  Summer [id number] therapeutic phlebotomies to decrease Hgb x 2.    Since last visit. Patient reports feeling tired at times. No night sweats, or  weight loss or decreased.  No painful fingers or toes, gout, dizziness, headache. Report history of blurred vision resolved without phlebotomy.  No leg swelling, chest pain, short of breath. No itchiness.      PHYSICAL EXAMINATION: ECOG PERFORMANCE STATUS: {CHL ONC ECOG PS:[phone]}  There were no vitals filed for this visit. There were no vitals filed for this visit.  GENERAL: alert, no distress and comfortable SKIN: skin color normal and no bruising or petechiae or jaundice on exposed skin EYES: normal, sclera clear OROPHARYNX: no exudate  NECK: No palpable mass LYMPH:  no palpable cervical, axillary lymphadenopathy  LUNGS: clear to auscultation and percussion with normal breathing effort HEART: regular rate & rhythm  ABDOMEN: abdomen soft, non-tender and nondistended. Musculoskeletal: no edema NEURO: no focal motor/sensory deficits  Relevant data reviewed during this visit included ***

## 2024-04-15 NOTE — Assessment & Plan Note (Addendum)
 CBC, Cmp LDH in 3 months. Aspirin 81 mg twice daily Return in 3 months. Referral to [facility]

## 2024-04-16 ENCOUNTER — Other Ambulatory Visit: Payer: Self-pay | Admitting: *Deleted

## 2024-04-16 ENCOUNTER — Inpatient Hospital Stay

## 2024-04-16 ENCOUNTER — Telehealth: Payer: Self-pay | Admitting: *Deleted

## 2024-04-16 VITALS — BP 117/74 | HR 84 | Temp 97.0°F | Resp 17 | Wt 193.2 lb

## 2024-04-16 DIAGNOSIS — D471 Chronic myeloproliferative disease: Secondary | ICD-10-CM

## 2024-04-16 DIAGNOSIS — D751 Secondary polycythemia: Secondary | ICD-10-CM | POA: Diagnosis not present

## 2024-04-16 DIAGNOSIS — D45 Polycythemia vera: Secondary | ICD-10-CM

## 2024-04-16 DIAGNOSIS — E611 Iron deficiency: Secondary | ICD-10-CM

## 2024-04-16 DIAGNOSIS — R718 Other abnormality of red blood cells: Secondary | ICD-10-CM

## 2024-04-16 DIAGNOSIS — D75839 Thrombocytosis, unspecified: Secondary | ICD-10-CM | POA: Insufficient documentation

## 2024-04-16 LAB — VITAMIN B12: Vitamin B-12: 691 pg/mL (ref 180–914)

## 2024-04-16 LAB — CMP (CANCER CENTER ONLY)
ALT: 7 U/L (ref 0–44)
AST: 14 U/L — ABNORMAL LOW (ref 15–41)
Albumin: 4.2 g/dL (ref 3.5–5.0)
Alkaline Phosphatase: 37 U/L — ABNORMAL LOW (ref 38–126)
Anion gap: 8 (ref 5–15)
BUN: 16 mg/dL (ref 6–20)
CO2: 25 mmol/L (ref 22–32)
Calcium: 9.2 mg/dL (ref 8.9–10.3)
Chloride: 103 mmol/L (ref 98–111)
Creatinine: 0.84 mg/dL (ref 0.44–1.00)
GFR, Estimated: >60 mL/min (ref >60)
Glucose, Bld: 82 mg/dL (ref 70–99)
Potassium: 4 mmol/L (ref 3.5–5.1)
Sodium: 136 mmol/L (ref 135–145)
Total Bilirubin: 0.6 mg/dL (ref 0.0–1.2)
Total Protein: 7.8 g/dL (ref 6.5–8.1)

## 2024-04-16 LAB — CBC WITH DIFFERENTIAL (CANCER CENTER ONLY)
Abs Immature Granulocytes: 0 K/uL (ref 0.00–0.07)
Basophils Absolute: 0 K/uL (ref 0.0–0.1)
Basophils Relative: 0 %
Eosinophils Absolute: 0.2 K/uL (ref 0.0–0.5)
Eosinophils Relative: 3 %
HCT: 46.1 % — ABNORMAL HIGH (ref 36.0–46.0)
Hemoglobin: 15.5 g/dL — ABNORMAL HIGH (ref 12.0–15.0)
Immature Granulocytes: 0 %
Lymphocytes Relative: 43 %
Lymphs Abs: 2.3 K/uL (ref 0.7–4.0)
MCH: 27 pg (ref 26.0–34.0)
MCHC: 33.6 g/dL (ref 30.0–36.0)
MCV: 80.2 fL (ref 80.0–100.0)
Monocytes Absolute: 0.4 K/uL (ref 0.1–1.0)
Monocytes Relative: 8 %
Neutro Abs: 2.5 K/uL (ref 1.7–7.7)
Neutrophils Relative %: 46 %
Platelet Count: 595 K/uL — ABNORMAL HIGH (ref 150–400)
RBC: 5.75 MIL/uL — ABNORMAL HIGH (ref 3.87–5.11)
RDW: 18.5 % — ABNORMAL HIGH (ref 11.5–15.5)
WBC Count: 5.4 K/uL (ref 4.0–10.5)
nRBC: 0 % (ref 0.0–0.2)

## 2024-04-16 LAB — IRON AND IRON BINDING CAPACITY (CC-WL,HP ONLY)
Iron: 51 ug/dL (ref 28–170)
Saturation Ratios: 12 % (ref 10.4–31.8)
TIBC: 437 ug/dL (ref 250–450)
UIBC: 386 ug/dL (ref 148–442)

## 2024-04-16 LAB — FOLATE: Folate: 12.5 ng/mL (ref >5.9)

## 2024-04-16 LAB — LACTATE DEHYDROGENASE: LDH: 206 U/L — ABNORMAL HIGH (ref 98–192)

## 2024-04-16 LAB — FERRITIN: Ferritin: 17 ng/mL (ref 11–307)

## 2024-04-16 NOTE — Telephone Encounter (Signed)
 Copy of referral, patient demographic sheet, today's MD progress note & labs faxed to [facility] Stem Cell Transplant Team - Dr Reyna.  [phone], fax confirmation received.

## 2024-04-16 NOTE — Assessment & Plan Note (Addendum)
 Reported fatigue.  Given JAK2 mutation, polycythemia and thrombocytosis, will avoid iron repletion constantly. Will trial iron supplement 325 mg daily for 1 week, then stop.  Patient will let us  know if any changes.

## 2024-04-20 ENCOUNTER — Telehealth: Payer: Self-pay

## 2024-04-20 NOTE — Telephone Encounter (Signed)
 Scheduled appointment per 8/29 los. Unable to make contact with the patient due to mailbox being full. Patient will be mail a reminder.

## 2024-05-11 ENCOUNTER — Telehealth: Payer: Self-pay

## 2024-05-11 NOTE — Telephone Encounter (Signed)
 Left the patient a voicemail with the rescheduled appointment details per provider on PAL.

## 2024-05-14 DIAGNOSIS — D471 Chronic myeloproliferative disease: Secondary | ICD-10-CM | POA: Diagnosis not present

## 2024-05-17 DIAGNOSIS — R6889 Other general symptoms and signs: Secondary | ICD-10-CM | POA: Diagnosis not present

## 2024-05-17 DIAGNOSIS — D471 Chronic myeloproliferative disease: Secondary | ICD-10-CM | POA: Diagnosis not present

## 2024-06-03 DIAGNOSIS — D471 Chronic myeloproliferative disease: Secondary | ICD-10-CM | POA: Diagnosis not present

## 2024-06-03 DIAGNOSIS — D45 Polycythemia vera: Secondary | ICD-10-CM | POA: Diagnosis not present

## 2024-06-24 DIAGNOSIS — D471 Chronic myeloproliferative disease: Secondary | ICD-10-CM | POA: Diagnosis not present

## 2024-06-24 NOTE — Progress Notes (Signed)
 Reason for visit: JAK2 V617F+ MPN that is felt to be most c/w PV  History of present illness: 48yo F with no significant PMH who presents for evaluation of a JAK2 V617F+ MPN that is felt to be most consistent with PV.   Reports she was first noted to have elevated hb and plt count in 2021.    She was evaluated by [facility] in 01/2022 at which time, wbc 5.6, hb 15.4, plt 584.  She was found to be JAK2 V617F positive from peripheral blood in 02/2022.  She had a bone marrow biopsy done 02/2023 that has now been reviewed by heme path here at [facility].  The marrow was 45% cellular with trilineage hematopiesis, atypical megs, less than 5% blasts, no increased fibrosis, and iron stain on clot was negative.   When we first met and evaluated pt here on 05/17/24, epo level was low at 3.4 and peripheral blood JAK2 V617F mutation was positive.  On this date, wbc 4.7, anc 2500, hb 15.8, hematocrit 46.9, plt 699, ldh and uric acid wnl, ferritin low at 7.   Current status: Pt presents for follow up.  Headaches well controlled on bid baby aspirin.  Pt denies pruritus.  Pt denies bone pain.  Fatigue waxes and wanes.  She is a little late in her menstrual cycle which usually happens q21days.  No other complaints.   Home medications: Vit E Fish oil Vit D Vit B complex Aspirin 81mg  bid  Review of systems: 12 point review of systems negative apart from that noted in HPI.  Physical exam: BP 120/77 (BP Location: Left arm, Patient Position: Sitting)   Pulse 87   Temp 97.9 F (36.6 C) (Temporal)   Resp 16   Wt 87.7 kg (193 lb 4.8 oz)   SpO2 98%   BMI 29.39 kg/m  Gen: nad Heent: eomi, anicteric sclera, clear op Neck: supple Heart: rrr no m/r Lungs: cta b no wh/rh/cr Abd: soft nd nttp +bs no hsm appreciated Extr: no le edema Lymph: no cervical, supraclavicular, axillary l/a appreciated Skin: warm dry no rash Neuro: alert oriented appropriate nonfocal ECOG PS 0  Laboratory review:  Latest  Reference Range & Units 06/24/24 15:48  WBC 4.40 - 11.00 10*3/uL 7.00  RBC 4.10 - 5.10 10*6/uL 5.30 (H)  Hemoglobin 12.3 - 15.3 g/dL 85.5  Hematocrit 64.0 - 44.6 % 42.5  Mean Corpuscular Volume (MCV) 80.0 - 96.0 fL 80.2  MCH 27.5 - 33.2 pg 27.2 (L)  Mean Corpuscular Hemoglobin Conc (MCHC) 33.0 - 37.0 g/dL 66.0  Red Cell Distribution Width (RDW-CV) 12.3 - 17.0 % 16.1  Platelet Count (Plt) 150 - 450 10*3/uL 647 (H)  Mean Platelet Volume (MPV) 6.8 - 10.2 fL 6.4 (L)  Neutrophils % % 52  Lymphocytes % % 38  Monocytes % % 7  Eosinophils % % 3  Basophils % % 1  nRBC % % 0  Neutrophils Absolute 1.80 - 7.80 10*3/uL 3.60  Lymphocytes Absolute 1.00 - 4.80 10*3/uL 2.60  Monocytes Absolute 0.00 - 0.80 10*3/uL 0.50  Eosinophils Absolute 0.00 - 0.50 10*3/uL 0.20  Basophils Absolute 0.00 - 0.20 10*3/uL 0.10  nRBC Absolute <=0.00 10*3/uL 0.00  (H): Data is abnormally high (L): Data is abnormally low  Latest Reference Range & Units 06/24/24 15:48  Sodium 136 - 145 mmol/L 136  Potassium 3.4 - 4.5 mmol/L 3.9  Chloride 98 - 107 mmol/L 101  CO2 21 - 31 mmol/L 28  Anion Gap 6 - 14 mmol/L 7  Glucose 70 - 99 mg/dL 86  Blood Urea Nitrogen 7 - 25 mg/dL 10  Creatinine 9.39 - 8.79 mg/dL 9.16  BUN/Creatinine Ratio  See Comment  eGFR >59 mL/min/1.61m2 88  Calcium Level Total 8.6 - 10.3 mg/dL 9.0  Uric Acid 2.3 - 6.6 mg/dL 6.5  LDH 859 - 728 U/L 206   Lfts wnl  Outside labs-- 01/30/22 wbc 5.6, hb 15.4, plt 584 03/01/22 JAK2 V617F+ peripheral blood 01/16/22 wbc 5.7, hb 16.9, plt 565 02/28/23 hb 15.5, plt 540  Pathology review: 05/17/24 mpn ngs peripheral blood-- -JAK2 V617F 9.74%  05/14/24 [facility] review of OSH bone marrow biopsy-- -45% cellular with trilineage hematopoiesis, atypical megs, less than 5% blasts, no increased reticulin fibrosis -staining for iron on clot is negative  Assessment and plan: 48yo F with no significant PMH who presents for evaluation of a JAK2 V617F+ MPN that is  felt to be most consistent with PV.  Pt has never had a thrombotic event.   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   She is iron deficient and I think a bit symptomatic from being iron deficient in the form of fatigue.  She is not actively trying to prevent pregnancy and would like to have a child.   Today, wbc 7, anc 3600, hematocrit at goal of <45 at 42.5 having been therapeutically phlebotomized for hematocrit of 45.4 on 06/03/24, plt 647, ldh and uric acid wnl, lfts wnl.   No indication for therapeutic phlebotomy today.   Pt is to continue baby aspirin that she is on bid for headache control.   Start RoPEG-IFN 100mcg subcutaneous q2weeks today.   Will hold off on repleting iron fully until we have consistently controlled her hematocrit w/o need for phlebotomy with RoPEG-IFN.   Recommend pt see high risk OB in the setting of her PV if she would like to discuss pregnancy.  She is also wondering if she might be entering perimenopause.   RTC in 6 weeks.   No interim labs needed.   Genell Paterson, MD Heme/onc staff

## 2024-07-01 NOTE — Telephone Encounter (Signed)
 Direct call to triage.   [facility] called to request a refill of Besremi.  Pharmacy has been added to chart.  Last fill: 06/11/24  Please refill, if appropriate.

## 2024-07-01 NOTE — Telephone Encounter (Signed)
 There are 11 refills on my script.   What am I missing in terms of what I should change?  Looping in pharmacy.   Thanks, Linda Ross

## 2024-07-19 ENCOUNTER — Ambulatory Visit

## 2024-07-20 ENCOUNTER — Inpatient Hospital Stay

## 2024-07-28 ENCOUNTER — Telehealth: Payer: Self-pay

## 2024-07-28 NOTE — Telephone Encounter (Signed)
 Tried to rescheduled patients missed appointment. Called and could not leave a voicemail, the vm box is full.

## 2024-07-30 ENCOUNTER — Other Ambulatory Visit: Payer: Self-pay | Admitting: Obstetrics and Gynecology

## 2024-07-30 DIAGNOSIS — Z1231 Encounter for screening mammogram for malignant neoplasm of breast: Secondary | ICD-10-CM

## 2024-08-05 DIAGNOSIS — D471 Chronic myeloproliferative disease: Secondary | ICD-10-CM | POA: Diagnosis not present

## 2024-08-05 DIAGNOSIS — D45 Polycythemia vera: Secondary | ICD-10-CM | POA: Diagnosis not present

## 2024-08-10 ENCOUNTER — Ambulatory Visit
Admission: RE | Admit: 2024-08-10 | Discharge: 2024-08-10 | Disposition: A | Discharge status: Home / Self Care | Source: Ambulatory Visit | Attending: Obstetrics and Gynecology | Admitting: Obstetrics and Gynecology

## 2024-08-10 DIAGNOSIS — Z1231 Encounter for screening mammogram for malignant neoplasm of breast: Secondary | ICD-10-CM
